# Patient Record
Sex: Female | Born: 1965 | Race: Black or African American | Hispanic: No | Marital: Married | Smoking: Never smoker
Health system: Southern US, Community
[De-identification: ages and names within clinical notes are randomized; demographics above are authoritative.]

## PROBLEM LIST (undated history)

## (undated) DIAGNOSIS — E119 Type 2 diabetes mellitus without complications: Secondary | ICD-10-CM

## (undated) DIAGNOSIS — F419 Anxiety disorder, unspecified: Secondary | ICD-10-CM

## (undated) DIAGNOSIS — F32A Depression, unspecified: Secondary | ICD-10-CM

## (undated) DIAGNOSIS — E7800 Pure hypercholesterolemia, unspecified: Secondary | ICD-10-CM

## (undated) DIAGNOSIS — I10 Essential (primary) hypertension: Secondary | ICD-10-CM

## (undated) HISTORY — PX: TUBAL LIGATION: SHX77

## (undated) HISTORY — PX: FOOT SURGERY: SHX648

## (undated) HISTORY — PX: BACK SURGERY: SHX140

---

## 2010-08-03 ENCOUNTER — Emergency Department (HOSPITAL_BASED_OUTPATIENT_CLINIC_OR_DEPARTMENT_OTHER)
Admission: EM | Admit: 2010-08-03 | Discharge: 2010-08-03 | Disposition: A | Discharge status: Home / Self Care | Payer: Managed Care, Other (non HMO) | Attending: Emergency Medicine | Admitting: Emergency Medicine

## 2010-08-03 DIAGNOSIS — I10 Essential (primary) hypertension: Secondary | ICD-10-CM | POA: Insufficient documentation

## 2010-08-03 DIAGNOSIS — G8929 Other chronic pain: Secondary | ICD-10-CM | POA: Insufficient documentation

## 2010-08-03 DIAGNOSIS — M542 Cervicalgia: Secondary | ICD-10-CM | POA: Insufficient documentation

## 2010-08-03 LAB — BASIC METABOLIC PANEL
Calcium: 9.3 mg/dL (ref 8.4–10.5)
Creatinine, Ser: 0.7 mg/dL (ref 0.4–1.2)
GFR calc Af Amer: >60 mL/min (ref >60)
GFR calc non Af Amer: >60 mL/min (ref >60)
Sodium: 137 mEq/L (ref 135–145)

## 2010-09-14 ENCOUNTER — Other Ambulatory Visit: Payer: Self-pay | Admitting: Neurosurgery

## 2010-09-14 DIAGNOSIS — M542 Cervicalgia: Secondary | ICD-10-CM

## 2010-09-25 MED ORDER — DIAZEPAM 2 MG PO TABS: 10.0000 mg | ORAL_TABLET | Freq: Once | ORAL | Status: DC | PRN

## 2010-09-26 MED ORDER — DIAZEPAM 2 MG PO TABS
10.0000 mg | ORAL_TABLET | Freq: Once | ORAL | Status: AC | PRN
  Administered 2010-09-27: 10 mg via ORAL

## 2010-09-27 ENCOUNTER — Ambulatory Visit
Admission: RE | Admit: 2010-09-27 | Discharge: 2010-09-27 | Disposition: A | Discharge status: Home / Self Care | Payer: Managed Care, Other (non HMO) | Source: Ambulatory Visit | Attending: Neurosurgery | Admitting: Neurosurgery

## 2010-09-27 VITALS — BP 100/85 | HR 84

## 2010-09-27 DIAGNOSIS — M542 Cervicalgia: Secondary | ICD-10-CM

## 2010-09-27 MED ORDER — DEXTROSE-NACL 5-0.45 % IV SOLN: INTRAVENOUS | Status: DC

## 2010-09-27 MED ORDER — IOHEXOL 300 MG/ML  SOLN
10.0000 mL | Freq: Once | INTRAMUSCULAR | Status: AC | PRN
  Administered 2010-09-27: 10 mL via INTRATHECAL

## 2010-09-27 MED ORDER — IOHEXOL 300 MG/ML  SOLN: 10.0000 mL | Freq: Once | INTRAMUSCULAR | Status: AC | PRN

## 2010-12-03 ENCOUNTER — Other Ambulatory Visit: Payer: Self-pay | Admitting: Neurosurgery

## 2010-12-03 ENCOUNTER — Ambulatory Visit
Admission: RE | Admit: 2010-12-03 | Discharge: 2010-12-03 | Disposition: A | Discharge status: Home / Self Care | Payer: Managed Care, Other (non HMO) | Source: Ambulatory Visit | Attending: Neurosurgery | Admitting: Neurosurgery

## 2010-12-03 DIAGNOSIS — M5020 Other cervical disc displacement, unspecified cervical region: Secondary | ICD-10-CM

## 2010-12-03 DIAGNOSIS — M5412 Radiculopathy, cervical region: Secondary | ICD-10-CM

## 2013-12-21 DIAGNOSIS — R519 Headache, unspecified: Secondary | ICD-10-CM

## 2013-12-21 DIAGNOSIS — K219 Gastro-esophageal reflux disease without esophagitis: Secondary | ICD-10-CM

## 2013-12-21 HISTORY — DX: Headache, unspecified: R51.9

## 2013-12-21 HISTORY — DX: Gastro-esophageal reflux disease without esophagitis: K21.9

## 2014-05-16 DIAGNOSIS — E6609 Other obesity due to excess calories: Secondary | ICD-10-CM | POA: Insufficient documentation

## 2014-05-16 DIAGNOSIS — E669 Obesity, unspecified: Secondary | ICD-10-CM | POA: Insufficient documentation

## 2014-05-16 HISTORY — DX: Other obesity due to excess calories: E66.09

## 2014-05-16 HISTORY — DX: Obesity, unspecified: E66.9

## 2014-08-09 DIAGNOSIS — E785 Hyperlipidemia, unspecified: Secondary | ICD-10-CM

## 2014-08-09 HISTORY — DX: Hyperlipidemia, unspecified: E78.5

## 2015-02-06 DIAGNOSIS — M722 Plantar fascial fibromatosis: Secondary | ICD-10-CM

## 2015-02-06 HISTORY — DX: Plantar fascial fibromatosis: M72.2

## 2015-05-11 DIAGNOSIS — K5909 Other constipation: Secondary | ICD-10-CM | POA: Insufficient documentation

## 2015-05-11 HISTORY — DX: Other constipation: K59.09

## 2015-12-20 DIAGNOSIS — I10 Essential (primary) hypertension: Secondary | ICD-10-CM

## 2015-12-20 HISTORY — DX: Essential (primary) hypertension: I10

## 2015-12-26 DIAGNOSIS — M542 Cervicalgia: Secondary | ICD-10-CM

## 2015-12-26 DIAGNOSIS — S29012A Strain of muscle and tendon of back wall of thorax, initial encounter: Secondary | ICD-10-CM

## 2015-12-26 HISTORY — DX: Strain of muscle and tendon of back wall of thorax, initial encounter: S29.012A

## 2015-12-26 HISTORY — DX: Cervicalgia: M54.2

## 2016-01-09 DIAGNOSIS — Z0000 Encounter for general adult medical examination without abnormal findings: Secondary | ICD-10-CM

## 2016-01-09 HISTORY — DX: Encounter for general adult medical examination without abnormal findings: Z00.00

## 2017-02-07 DIAGNOSIS — G4700 Insomnia, unspecified: Secondary | ICD-10-CM

## 2017-02-07 HISTORY — DX: Insomnia, unspecified: G47.00

## 2019-07-27 DIAGNOSIS — E1165 Type 2 diabetes mellitus with hyperglycemia: Secondary | ICD-10-CM | POA: Insufficient documentation

## 2019-07-27 HISTORY — DX: Type 2 diabetes mellitus with hyperglycemia: E11.65

## 2021-03-28 DIAGNOSIS — M2021 Hallux rigidus, right foot: Secondary | ICD-10-CM

## 2021-03-28 DIAGNOSIS — M7750 Other enthesopathy of unspecified foot: Secondary | ICD-10-CM

## 2021-03-28 HISTORY — DX: Hallux rigidus, right foot: M20.21

## 2021-03-28 HISTORY — DX: Other enthesopathy of unspecified foot and ankle: M77.50

## 2021-05-04 HISTORY — PX: FOOT SURGERY: SHX648

## 2021-05-09 DIAGNOSIS — Z9889 Other specified postprocedural states: Secondary | ICD-10-CM

## 2021-05-09 HISTORY — DX: Other specified postprocedural states: Z98.890

## 2022-05-04 ENCOUNTER — Inpatient Hospital Stay: Admission: RE | Admit: 2022-05-04 | Payer: Self-pay | Source: Ambulatory Visit

## 2022-05-04 ENCOUNTER — Ambulatory Visit (INDEPENDENT_AMBULATORY_CARE_PROVIDER_SITE_OTHER): Payer: Managed Care, Other (non HMO)

## 2022-05-04 ENCOUNTER — Ambulatory Visit
Admission: EM | Admit: 2022-05-04 | Discharge: 2022-05-04 | Disposition: A | Discharge status: Home / Self Care | Payer: Managed Care, Other (non HMO) | Attending: Urgent Care | Admitting: Urgent Care

## 2022-05-04 DIAGNOSIS — M25562 Pain in left knee: Secondary | ICD-10-CM

## 2022-05-04 DIAGNOSIS — M1712 Unilateral primary osteoarthritis, left knee: Secondary | ICD-10-CM

## 2022-05-04 DIAGNOSIS — E119 Type 2 diabetes mellitus without complications: Secondary | ICD-10-CM

## 2022-05-04 DIAGNOSIS — I10 Essential (primary) hypertension: Secondary | ICD-10-CM

## 2022-05-04 HISTORY — DX: Pure hypercholesterolemia, unspecified: E78.00

## 2022-05-04 HISTORY — DX: Type 2 diabetes mellitus without complications: E11.9

## 2022-05-04 HISTORY — DX: Anxiety disorder, unspecified: F41.9

## 2022-05-04 HISTORY — DX: Essential (primary) hypertension: I10

## 2022-05-04 HISTORY — DX: Depression, unspecified: F32.A

## 2022-05-04 MED ORDER — PREDNISONE 20 MG PO TABS: ORAL_TABLET | ORAL | 0 refills | Status: DC

## 2022-05-04 MED ORDER — HYDROCHLOROTHIAZIDE 12.5 MG PO TABS: 12.5000 mg | ORAL_TABLET | Freq: Every day | ORAL | 0 refills | Status: DC

## 2022-05-04 NOTE — ED Provider Notes (Signed)
[facility]  Note:  This document was prepared using Systems analyst and may include unintentional dictation errors.  MRN: [id number] DOB: 11/21/1965  Subjective:   Ana Ruiz is a 57 y.o. female presenting for 3-day history of persistent left knee pain with swelling.  Patient has been working through the arthritis in her back and was advised by her orthopedist to start using her legs more to offload her back.  She has been doing so and now her left knee is hurting.  No fall, trauma, history of knee surgery.  No history of gout.  Patient does have a history of hypertension, forgot to take her blood pressure medication this morning.  Has type 2 diabetes treated without insulin, last A1c 3 months ago was 6.1% per patient.  No current facility-administered medications for this encounter.  Current Outpatient Medications:    amitriptyline (ELAVIL) 50 MG tablet, Take by mouth., Disp: , Rfl:    amLODipine (NORVASC) 5 MG tablet, , Disp: , Rfl:    linaclotide (LINZESS) 145 MCG CAPS capsule, Take by mouth., Disp: , Rfl:    metFORMIN (GLUCOPHAGE-XR) 500 MG 24 hr tablet, Take by mouth., Disp: , Rfl:    metoprolol succinate (TOPROL-XL) 50 MG 24 hr tablet, , Disp: , Rfl:    pravastatin (PRAVACHOL) 20 MG tablet, , Disp: , Rfl:    venlafaxine (EFFEXOR) 37.5 MG tablet, Take by mouth., Disp: , Rfl:    Allergies  Allergen Reactions   Naproxen Nausea Only    CP    Past Medical History:  Diagnosis Date   Anxiety    Depression    Diabetes mellitus without complication (HCC)    High cholesterol    Hypertension      Past Surgical History:  Procedure Laterality Date   BACK SURGERY     FOOT SURGERY      No family history on file.  Social History   Tobacco Use   Smoking status: Never   Smokeless tobacco: Never  Vaping Use   Vaping Use: Never used  Substance Use Topics   Alcohol use: Never   Drug use: Never    ROS   Objective:    Vitals: BP (!) 179/101 (BP Location: Right Arm)   Pulse (!) 102   Temp 98.5 F (36.9 C)   Resp 20   LMP 11/12/2010   SpO2 97%   BP Readings from Last 3 Encounters:  05/04/22 (!) 179/101  09/27/10 100/85   Physical Exam Constitutional:      General: She is not in acute distress.    Appearance: Normal appearance. She is well-developed. She is not ill-appearing, toxic-appearing or diaphoretic.  HENT:     Head: Normocephalic and atraumatic.     Nose: Nose normal.     Mouth/Throat:     Mouth: Mucous membranes are moist.  Eyes:     General: No scleral icterus.       Right eye: No discharge.        Left eye: No discharge.     Extraocular Movements: Extraocular movements intact.  Cardiovascular:     Rate and Rhythm: Normal rate and regular rhythm.     Heart sounds: Normal heart sounds. No murmur heard.    No friction rub. No gallop.  Pulmonary:     Effort: Pulmonary effort is normal. No respiratory distress.     Breath sounds: No stridor. No wheezing, rhonchi or rales.  Chest:     Chest wall:  No tenderness.  Musculoskeletal:     Left knee: Swelling and effusion present. No deformity, erythema, ecchymosis, lacerations, bony tenderness or crepitus. Decreased range of motion. Tenderness present over the medial joint line, lateral joint line and patellar tendon. Normal alignment, normal meniscus and normal patellar mobility.  Skin:    General: Skin is warm and dry.  Neurological:     General: No focal deficit present.     Mental Status: She is alert and oriented to person, place, and time.     Cranial Nerves: No cranial nerve deficit.     Motor: No weakness.     Coordination: Coordination normal.     Gait: Gait normal.     Comments: Negative Romberg and pronator drift.  No facial asymmetry.  Psychiatric:        Mood and Affect: Mood normal.        Behavior: Behavior normal.     DG Knee Complete 4 Views Left  Result Date: 05/04/2022 CLINICAL DATA:  Left knee pain. EXAM:  LEFT KNEE - COMPLETE 4+ VIEW COMPARISON:  None Available. FINDINGS: No fracture. No subluxation or dislocation. Subtle hypertrophic spurring noted all 3 compartments. Small joint effusion evident. IMPRESSION: Mild tricompartmental degenerative changes with small joint effusion. Electronically Signed   By: Misty Stanley M.D.   On: 05/04/2022 13:13    Applied a 4" Ace wrap to the left knee  Assessment and Plan :   PDMP not reviewed this encounter.  1. Acute pain of left knee   2. Arthritis of left knee   3. Type 2 diabetes mellitus treated without insulin ([place])   4. Essential hypertension     Physical exam findings not consistent with an acute encephalopathy, hypertensive emergency.  Emphasized need for medical compliance with her blood pressure medication.  She would benefit tremendously from using prednisone in the context of having arthritis for her severe knee pain.  I will supplement her treatment with hydrochlorothiazide to avoid further increases in her blood pressure.  She is to follow-up with orthopedist soon as possible. Counseled patient on potential for adverse effects with medications prescribed/recommended today, ER and return-to-clinic precautions discussed, patient verbalized understanding.    Ana Ruiz, [place] 05/04/22 1334

## 2022-05-04 NOTE — Discharge Instructions (Signed)
Follow-up with your orthopedist soon as possible.  You may end up needing an MRI to evaluate the soft tissue of your knee including the ligaments and meniscus.  In the meantime, take the prednisone as a strong anti-inflammatory to help with your severe knee pain.  For the next 7 days I also mentioned to take hydrochlorothiazide to help avoid further increases in blood pressure.  Make sure you continue to take your regular blood pressure medicine as well.  Wear the Ace wrap during the day but not at night when you sleep.

## 2022-05-04 NOTE — ED Triage Notes (Signed)
Pt c/o left knee pain/swelling x 2-3 days-denies injury-NAD-steady gait

## 2022-06-12 DIAGNOSIS — D709 Neutropenia, unspecified: Secondary | ICD-10-CM | POA: Insufficient documentation

## 2022-06-12 HISTORY — DX: Neutropenia, unspecified: D70.9

## 2022-08-13 HISTORY — PX: HYSTEROSCOPY WITH D & C: SHX1775

## 2023-03-10 DIAGNOSIS — R069 Unspecified abnormalities of breathing: Secondary | ICD-10-CM | POA: Diagnosis not present

## 2023-04-15 DIAGNOSIS — Z981 Arthrodesis status: Secondary | ICD-10-CM

## 2023-04-15 DIAGNOSIS — M5033 Other cervical disc degeneration, cervicothoracic region: Secondary | ICD-10-CM

## 2023-04-15 HISTORY — DX: Arthrodesis status: Z98.1

## 2023-04-15 HISTORY — DX: Other cervical disc degeneration, cervicothoracic region: M50.33

## 2023-04-17 DIAGNOSIS — D250 Submucous leiomyoma of uterus: Secondary | ICD-10-CM | POA: Insufficient documentation

## 2023-04-17 DIAGNOSIS — N950 Postmenopausal bleeding: Secondary | ICD-10-CM | POA: Insufficient documentation

## 2023-04-17 DIAGNOSIS — D251 Intramural leiomyoma of uterus: Secondary | ICD-10-CM | POA: Insufficient documentation

## 2023-04-17 HISTORY — DX: Submucous leiomyoma of uterus: D25.0

## 2023-04-17 HISTORY — DX: Postmenopausal bleeding: N95.0

## 2023-06-05 ENCOUNTER — Encounter: Payer: Self-pay | Admitting: Family Medicine

## 2023-06-05 ENCOUNTER — Ambulatory Visit: Admitting: Family Medicine

## 2023-06-05 VITALS — BP 130/84 | HR 91 | Temp 98.5°F | Ht 65.0 in | Wt 211.1 lb

## 2023-06-05 DIAGNOSIS — I10 Essential (primary) hypertension: Secondary | ICD-10-CM

## 2023-06-05 DIAGNOSIS — Z8249 Family history of ischemic heart disease and other diseases of the circulatory system: Secondary | ICD-10-CM

## 2023-06-05 DIAGNOSIS — E785 Hyperlipidemia, unspecified: Secondary | ICD-10-CM

## 2023-06-05 DIAGNOSIS — N950 Postmenopausal bleeding: Secondary | ICD-10-CM

## 2023-06-05 DIAGNOSIS — M542 Cervicalgia: Secondary | ICD-10-CM

## 2023-06-05 DIAGNOSIS — E1165 Type 2 diabetes mellitus with hyperglycemia: Secondary | ICD-10-CM

## 2023-06-05 DIAGNOSIS — G4709 Other insomnia: Secondary | ICD-10-CM | POA: Diagnosis not present

## 2023-06-05 DIAGNOSIS — K5909 Other constipation: Secondary | ICD-10-CM

## 2023-06-05 DIAGNOSIS — R0789 Other chest pain: Secondary | ICD-10-CM

## 2023-06-05 DIAGNOSIS — Z981 Arthrodesis status: Secondary | ICD-10-CM

## 2023-06-05 DIAGNOSIS — Z23 Encounter for immunization: Secondary | ICD-10-CM | POA: Diagnosis not present

## 2023-06-05 DIAGNOSIS — Z7984 Long term (current) use of oral hypoglycemic drugs: Secondary | ICD-10-CM

## 2023-06-05 HISTORY — DX: Encounter for immunization: Z23

## 2023-06-05 HISTORY — DX: Family history of ischemic heart disease and other diseases of the circulatory system: Z82.49

## 2023-06-05 HISTORY — DX: Other chest pain: R07.89

## 2023-06-05 MED ORDER — AMLODIPINE BESYLATE 5 MG PO TABS: 5.0000 mg | ORAL_TABLET | Freq: Every day | ORAL | 1 refills | Status: DC

## 2023-06-05 MED ORDER — VENLAFAXINE HCL 37.5 MG PO TABS: 37.5000 mg | ORAL_TABLET | Freq: Every day | ORAL | 1 refills | Status: DC

## 2023-06-05 MED ORDER — PRAVASTATIN SODIUM 20 MG PO TABS: 20.0000 mg | ORAL_TABLET | Freq: Every day | ORAL | 1 refills | Status: DC

## 2023-06-05 MED ORDER — METHOCARBAMOL 500 MG PO TABS: 500.0000 mg | ORAL_TABLET | Freq: Three times a day (TID) | ORAL | 0 refills | Status: DC | PRN

## 2023-06-05 MED ORDER — LINACLOTIDE 145 MCG PO CAPS: 145.0000 ug | ORAL_CAPSULE | Freq: Every day | ORAL | 1 refills | Status: DC

## 2023-06-05 MED ORDER — METOPROLOL SUCCINATE ER 50 MG PO TB24: 50.0000 mg | ORAL_TABLET | Freq: Every day | ORAL | 1 refills | Status: DC

## 2023-06-05 MED ORDER — METFORMIN HCL ER 500 MG PO TB24: 500.0000 mg | ORAL_TABLET | Freq: Two times a day (BID) | ORAL | 1 refills | Status: DC

## 2023-06-05 MED ORDER — AMITRIPTYLINE HCL 50 MG PO TABS: 50.0000 mg | ORAL_TABLET | Freq: Every day | ORAL | 1 refills | Status: DC

## 2023-06-05 MED ORDER — TIZANIDINE HCL 4 MG PO TABS: 4.0000 mg | ORAL_TABLET | Freq: Every day | ORAL | 1 refills | Status: DC

## 2023-06-05 NOTE — Progress Notes (Signed)
 Patient Office Visit  Assessment & Plan:  Benign essential hypertension -     amLODIPine Besylate; Take 1 tablet (5 mg total) by mouth daily.  Dispense: 90 tablet; Refill: 1 -     Metoprolol Succinate ER; Take 1 tablet (50 mg total) by mouth daily. Take with or immediately following a meal.  Dispense: 90 tablet; Refill: 1 -     CBC with Differential/Platelet -     COMPLETE METABOLIC PANEL WITHOUT GFR  Hyperlipidemia, unspecified hyperlipidemia type -     Pravastatin Sodium; Take 1 tablet (20 mg total) by mouth daily.  Dispense: 90 tablet; Refill: 1 -     Lipid panel  Other insomnia -     Amitriptyline HCl; Take 1 tablet (50 mg total) by mouth at bedtime.  Dispense: 90 tablet; Refill: 1  Type 2 diabetes mellitus with hyperglycemia, without long-term current use of insulin (HCC) -     metFORMIN HCl ER; Take 1 tablet (500 mg total) by mouth 2 (two) times daily with a meal.  Dispense: 180 tablet; Refill: 1 -     Hemoglobin A1c  Need for shingles vaccine -     Varicella-zoster vaccine IM  Other constipation -     TSH  Neck pain, bilateral -     tiZANidine HCl; Take 1 tablet (4 mg total) by mouth at bedtime.  Dispense: 90 tablet; Refill: 1 -     Methocarbamol; Take 1 tablet (500 mg total) by mouth every 8 (eight) hours as needed for muscle spasms.  Dispense: 90 tablet; Refill: 0  S/P cervical spinal fusion -     tiZANidine HCl; Take 1 tablet (4 mg total) by mouth at bedtime.  Dispense: 90 tablet; Refill: 1 -     Methocarbamol; Take 1 tablet (500 mg total) by mouth every 8 (eight) hours as needed for muscle spasms.  Dispense: 90 tablet; Refill: 0  Family history of heart disease -     Ambulatory referral to Cardiology  Atypical chest pain -     Ambulatory referral to Cardiology -     EKG 12-Lead  Postmenopausal bleeding  Other orders -     linaCLOtide; Take 1 capsule (145 mcg total) by mouth daily before breakfast.  Dispense: 90 capsule; Refill: 1 -     Venlafaxine HCl; Take  1 tablet (37.5 mg total) by mouth daily.  Dispense: 90 tablet; Refill: 1  Test results were reviewed and analyzed as part of the medical decision making of this visit.  Reviewed previous notes from [facility], orthopedic notes during office visit.  Recommend healthy diet i.e mediterranean/DASH diet, consistent exercise - 30 minutes 5 day per week, and gradual weight loss. EKG-normal sinus rhythm, no acute changes noted.  No previous EKG tracing to review however she did have a previous EKG done last year.  Cardiology consult ordered given risk factors and ongoing chest pain.  Return in 3 months or sooner if necessary. Return in about 3 months (around 09/04/2023), or if symptoms worsen or fail to improve.   Subjective:    Patient ID: Ana Ruiz, female    DOB: 11/25/1965  Age: 58 y.o. MRN: [id number]  Chief Complaint  Patient presents with   Medical Management of Chronic Issues   Establish Care    HPI Patient is here to establish primary care and discuss chronic medical issues which include type 2 diabetes, hypertension, hyperlipidemia, ongoing shoulder pain and arm pain, discussion of family history of heart disease,  insomnia.  Type 2 diabetes-previously controlled.  Denies diarrhea, peripheral swelling, hypoglycemia, excessive thirst, excessive urination, visual fluctuation, and fatigue.  Making an effort on diet control and exercise.  Has an up-to-date dilated eye exam.  Using medication as prescribed without difficulty.  Patient's weight remains stable.  Patient does eat healthy for the most part and stays active. Pt is eating better and is going to [facility] with her husband.  Hyperlipidemia-denies unusual muscle aches or muscle cramps or difficulty tolerating statin therapy.  Aware of need for diet control, exercise and healthy eating.  Patient is aware not to consume grapefruit juice or pomegranate juice. HTN-using antihypertensive medication without difficulty.  Denies associated  signs and symptoms including chest pain, shortness of breath, cough headache, peripheral swelling cramps spasms and palpitations.  Voices understanding of the potential for interference with blood pressure control with substances including high sodium intake, decongestions, herbal supplements weight loss supplements nutritional supplements.  Blood pressures at home are less than 140/90.   Atypical chest pain-patient has been having atypical chest pain sometimes on the left side sometimes on the right sometimes the shoulder is involved sometimes the shoulder is not fully ready pain.  She mentioned this to orthopedic and he was concerned that this could be a cardiac cause of chest pain shoulder pain.  Family history is positive for CAD in multiple relatives.  Patient has not seen cardiology in the past.  Patient has not had any chest pain or exertional shortness of breath with exercise.  Patient has been going to the [facility]. Insomnia- The 10-year ASCVD risk score (Arnett DK, et al., 2019) is: 10.2%  Past Medical History:  Diagnosis Date   Anxiety    Depression    Diabetes mellitus without complication (HCC)    High cholesterol    Hypertension    Past Surgical History:  Procedure Laterality Date   BACK SURGERY     ANTERIOR CERVICAL DISCECTOMY W/ FUSION   FOOT SURGERY Right 05/04/2021   Dr Romualdo Bolk- CHEILECTOMY   HYSTEROSCOPY WITH D & C  08/13/2022   Dr Janae Sauce   TUBAL LIGATION     Social History   Tobacco Use   Smoking status: Never   Smokeless tobacco: Never  Vaping Use   Vaping status: Never Used  Substance Use Topics   Alcohol use: Never   Drug use: Never   Family History  Problem Relation Age of Onset   Colon cancer Mother    Ovarian cancer Mother    Heart disease Mother    Pulmonary embolism Mother    Diabetes Mellitus II Maternal Aunt    Hypertension Maternal Uncle    Heart disease Maternal Uncle    Heart attack Maternal Uncle    Heart attack Maternal Uncle    Heart attack  Maternal Uncle    Leukemia Cousin    Lymphoma Cousin    Allergies  Allergen Reactions   Meloxicam Nausea And Vomiting   Naproxen Nausea Only    CP    ROS    Objective:    BP 130/84   Pulse 91   Temp 98.5 F (36.9 C)   Ht 5\' 5"  (1.651 m)   Wt 211 lb 2 oz (95.8 kg)   LMP 11/12/2010   SpO2 99%   BMI 35.13 kg/m  BP Readings from Last 3 Encounters:  06/05/23 130/84  05/04/22 (!) 179/101  09/27/10 100/85   Wt Readings from Last 3 Encounters:  06/05/23 211 lb 2 oz (95.8 kg)  Physical Exam Vitals and nursing note reviewed.  Constitutional:      Appearance: Normal appearance.  HENT:     Head: Normocephalic.     Right Ear: Tympanic membrane, ear canal and external ear normal.     Left Ear: Tympanic membrane, ear canal and external ear normal.  Eyes:     Extraocular Movements: Extraocular movements intact.     Conjunctiva/sclera: Conjunctivae normal.     Pupils: Pupils are equal, round, and reactive to light.  Cardiovascular:     Rate and Rhythm: Normal rate and regular rhythm.     Heart sounds: Normal heart sounds.  Pulmonary:     Effort: Pulmonary effort is normal.     Breath sounds: Normal breath sounds.  Musculoskeletal:     Right lower leg: No edema.     Left lower leg: No edema.  Neurological:     General: No focal deficit present.     Mental Status: She is alert and oriented to person, place, and time.  Psychiatric:        Mood and Affect: Mood normal.        Behavior: Behavior normal.        Thought Content: Thought content normal.        Judgment: Judgment normal.      Results for orders placed or performed in visit on 06/05/23  CBC with Differential/Platelet  Result Value Ref Range   WBC 4.5 3.8 - 10.8 Thousand/uL   RBC 4.95 3.80 - 5.10 Million/uL   Hemoglobin 13.3 11.7 - 15.5 g/dL   HCT 09.8 11.9 - 14.7 %   MCV 83.2 80.0 - 100.0 fL   MCH 26.9 (L) 27.0 - 33.0 pg   MCHC 32.3 32.0 - 36.0 g/dL   RDW 82.9 56.2 - 13.0 %   Platelets 205 140 -  400 Thousand/uL   MPV 10.9 7.5 - 12.5 fL   Neutro Abs 2,039 1,500 - 7,800 cells/uL   Absolute Lymphocytes 1,935 850 - 3,900 cells/uL   Absolute Monocytes 428 200 - 950 cells/uL   Eosinophils Absolute 68 15 - 500 cells/uL   Basophils Absolute 32 0 - 200 cells/uL   Neutrophils Relative % 45.3 %   Total Lymphocyte 43.0 %   Monocytes Relative 9.5 %   Eosinophils Relative 1.5 %   Basophils Relative 0.7 %  COMPLETE METABOLIC PANEL WITHOUT GFR  Result Value Ref Range   Glucose, Bld 96 65 - 99 mg/dL   BUN 9 7 - 25 mg/dL   Creat 8.65 7.84 - 6.96 mg/dL   BUN/Creatinine Ratio SEE NOTE: 6 - 22 (calc)   Sodium 140 135 - 146 mmol/L   Potassium 4.0 3.5 - 5.3 mmol/L   Chloride 100 98 - 110 mmol/L   CO2 28 20 - 32 mmol/L   Calcium 9.5 8.6 - 10.4 mg/dL   Total Protein 7.2 6.1 - 8.1 g/dL   Albumin 4.3 3.6 - 5.1 g/dL   Globulin 2.9 1.9 - 3.7 g/dL (calc)   AG Ratio 1.5 1.0 - 2.5 (calc)   Total Bilirubin 0.3 0.2 - 1.2 mg/dL   Alkaline phosphatase (APISO) 66 37 - 153 U/L   AST 19 10 - 35 U/L   ALT 25 6 - 29 U/L  Hemoglobin A1c  Result Value Ref Range   Hgb A1c MFr Bld 6.8 (H) <5.7 % of total Hgb   Mean Plasma Glucose 148 mg/dL   eAG (mmol/L) 8.2 mmol/L  Lipid panel  Result Value Ref Range  Cholesterol 206 (H) <200 mg/dL   HDL 94 > OR = 50 mg/dL   Triglycerides 85 <161 mg/dL   LDL Cholesterol (Calc) 94 mg/dL (calc)   Total CHOL/HDL Ratio 2.2 <5.0 (calc)   Non-HDL Cholesterol (Calc) 112 <130 mg/dL (calc)  TSH  Result Value Ref Range   TSH 1.63 0.40 - 4.50 mIU/L

## 2023-06-06 ENCOUNTER — Encounter: Payer: Self-pay | Admitting: Family Medicine

## 2023-06-06 LAB — COMPLETE METABOLIC PANEL WITHOUT GFR
AG Ratio: 1.5 (calc) (ref 1.0–2.5)
ALT: 25 U/L (ref 6–29)
AST: 19 U/L (ref 10–35)
Albumin: 4.3 g/dL (ref 3.6–5.1)
Alkaline phosphatase (APISO): 66 U/L (ref 37–153)
BUN: 9 mg/dL (ref 7–25)
CO2: 28 mmol/L (ref 20–32)
Calcium: 9.5 mg/dL (ref 8.6–10.4)
Chloride: 100 mmol/L (ref 98–110)
Creat: 0.84 mg/dL (ref 0.50–1.03)
Globulin: 2.9 g/dL (ref 1.9–3.7)
Glucose, Bld: 96 mg/dL (ref 65–99)
Potassium: 4 mmol/L (ref 3.5–5.3)
Sodium: 140 mmol/L (ref 135–146)
Total Bilirubin: 0.3 mg/dL (ref 0.2–1.2)
Total Protein: 7.2 g/dL (ref 6.1–8.1)

## 2023-06-06 LAB — CBC WITH DIFFERENTIAL/PLATELET
Absolute Lymphocytes: 1935 {cells}/uL (ref 850–3900)
Absolute Monocytes: 428 {cells}/uL (ref 200–950)
Basophils Absolute: 32 {cells}/uL (ref 0–200)
Basophils Relative: 0.7 %
Eosinophils Absolute: 68 {cells}/uL (ref 15–500)
Eosinophils Relative: 1.5 %
HCT: 41.2 % (ref 35.0–45.0)
Hemoglobin: 13.3 g/dL (ref 11.7–15.5)
MCH: 26.9 pg — ABNORMAL LOW (ref 27.0–33.0)
MCHC: 32.3 g/dL (ref 32.0–36.0)
MCV: 83.2 fL (ref 80.0–100.0)
MPV: 10.9 fL (ref 7.5–12.5)
Monocytes Relative: 9.5 %
Neutro Abs: 2039 {cells}/uL (ref 1500–7800)
Neutrophils Relative %: 45.3 %
Platelets: 205 10*3/uL (ref 140–400)
RBC: 4.95 10*6/uL (ref 3.80–5.10)
RDW: 14.1 % (ref 11.0–15.0)
Total Lymphocyte: 43 %
WBC: 4.5 10*3/uL (ref 3.8–10.8)

## 2023-06-06 LAB — LIPID PANEL
Cholesterol: 206 mg/dL — ABNORMAL HIGH (ref <200)
HDL: 94 mg/dL (ref >=50)
LDL Cholesterol (Calc): 94 mg/dL
Non-HDL Cholesterol (Calc): 112 mg/dL (ref <130)
Total CHOL/HDL Ratio: 2.2 (calc) (ref <5.0)
Triglycerides: 85 mg/dL (ref <150)

## 2023-06-06 LAB — HEMOGLOBIN A1C
Hgb A1c MFr Bld: 6.8 %{Hb} — ABNORMAL HIGH (ref <5.7)
Mean Plasma Glucose: 148 mg/dL
eAG (mmol/L): 8.2 mmol/L

## 2023-06-06 LAB — TSH: TSH: 1.63 m[IU]/L (ref 0.40–4.50)

## 2023-06-09 ENCOUNTER — Other Ambulatory Visit: Payer: Self-pay

## 2023-06-09 DIAGNOSIS — E1165 Type 2 diabetes mellitus with hyperglycemia: Secondary | ICD-10-CM

## 2023-06-09 MED ORDER — TIRZEPATIDE 2.5 MG/0.5ML ~~LOC~~ SOAJ: 2.5000 mg | SUBCUTANEOUS | 1 refills | Status: DC

## 2023-06-11 ENCOUNTER — Ambulatory Visit: Payer: Managed Care, Other (non HMO) | Admitting: Family Medicine

## 2023-07-21 DIAGNOSIS — E119 Type 2 diabetes mellitus without complications: Secondary | ICD-10-CM | POA: Insufficient documentation

## 2023-07-21 DIAGNOSIS — F32A Depression, unspecified: Secondary | ICD-10-CM | POA: Insufficient documentation

## 2023-07-21 DIAGNOSIS — F419 Anxiety disorder, unspecified: Secondary | ICD-10-CM | POA: Insufficient documentation

## 2023-07-22 ENCOUNTER — Ambulatory Visit

## 2023-07-22 VITALS — BP 150/98 | HR 114 | Ht 65.0 in | Wt 213.0 lb

## 2023-07-22 DIAGNOSIS — R000 Tachycardia, unspecified: Secondary | ICD-10-CM

## 2023-07-22 DIAGNOSIS — I10 Essential (primary) hypertension: Secondary | ICD-10-CM

## 2023-07-22 DIAGNOSIS — R0789 Other chest pain: Secondary | ICD-10-CM

## 2023-07-22 DIAGNOSIS — E782 Mixed hyperlipidemia: Secondary | ICD-10-CM | POA: Diagnosis not present

## 2023-07-22 HISTORY — DX: Tachycardia, unspecified: R00.0

## 2023-07-22 MED ORDER — CARVEDILOL 12.5 MG PO TABS: 12.5000 mg | ORAL_TABLET | Freq: Two times a day (BID) | ORAL | 3 refills | Status: AC

## 2023-07-22 NOTE — Assessment & Plan Note (Signed)
 Lipid panel from 06/05/2023 suboptimal with LDL 94, triglycerides 85, HDL 94, total cholesterol 098.  Continue pravastatin  20 mg once daily. If LDL remains above 70 mg/dL would recommend titrating up pravastatin  to 40 mg once daily.

## 2023-07-22 NOTE — Progress Notes (Signed)
 Cardiology Consultation:    Date:  07/22/2023   ID:  Ana Ruiz, DOB 1965-08-28, MRN [id number]  PCP:  Amadeo June, MD  Cardiologist:  Daymon Evans Romario Tith, MD   Referring MD: Amadeo June, MD   No chief complaint on file.    ASSESSMENT AND PLAN:   Ana Ruiz 58 year old woman with no significant prior cardiac history. Has longstanding history of hypertension, hyperlipidemia, elevated heart rates at baseline, diabetes mellitus, C-spine surgery in 2013 and recent hysterectomy 07/08/2023 for postmenopausal bleeding here for further evaluation of atypical chest pain which appears more consistent with pain in the neck radiating to bilateral shoulders.  No exertional symptoms of chest pain.  Problem List Items Addressed This Visit     Benign essential hypertension   Uncontrolled blood pressures. Target blood pressure below 130/80 mmHg. Continue amlodipine  5 mg once daily. Reports she was previously on losartan-hydrochlorothiazide  but has not been taking this for a long time.  Will take this off her medication list.  Discontinue metoprolol  succinate switch to carvedilol 12.5 mg twice daily for better blood pressure control profile. Advised to keep herself well-hydrated. Advised to reduce salt intake.  Will obtain transthoracic echocardiogram for further cardiac structure and function assessment.       Relevant Medications   carvedilol (COREG) 12.5 MG tablet   Hyperlipidemia   Lipid panel from 06/05/2023 suboptimal with LDL 94, triglycerides 85, HDL 94, total cholesterol 528.  Continue pravastatin  20 mg once daily. If LDL remains above 70 mg/dL would recommend titrating up pravastatin  to 40 mg once daily.       Relevant Medications   carvedilol (COREG) 12.5 MG tablet   Other Relevant Orders   ECHOCARDIOGRAM COMPLETE   Atypical chest pain - Primary   Pain does not appear cardiac in description. She does have cardiovascular risk factors however has excellent  functional status and denies any ongoing cardiac symptoms at this time.  Will obtain cardiac structure and function assessment with transthoracic echocardiogram given longstanding history of uncontrolled blood pressure. If no significant LV dysfunction or wall motion abnormalities, we will hold off on any further stress testing at this time.  If the nature of her symptoms change and more suggestive of cardiac causes, can consider further evaluation with stress test versus cardiac CT imaging.        Relevant Orders   EKG 12-Lead (Completed)   ECHOCARDIOGRAM COMPLETE   Sinus tachycardia   Reports long history of sinus tachycardia.  No obvious cause. She appears to be relatively asymptomatic from this. Will switch from metoprolol  succinate to carvedilol. Advised to keep herself well-hydrated and reduce salt intake.  Defer to PCP to assess for noncardiac causes of sinus tachycardia.      Return to clinic tentatively in 2 months.   History of Present Illness:    Ana Ruiz is a 58 y.o. female who is being seen today for the evaluation of chest pain at the request of Amadeo June, MD. Recently establish care with her PCP in April 2025.  Has history of hypertension, hyperlipidemia, type 2 diabetes mellitus, C-spine surgery in 2013 and recent hysterectomy 07/08/2023.  Referred for further evaluation of chest pain.  Pleasant woman here for the visit by herself.  Works in [employer] ge[employer] and currently off work for the last few weeks given her recent hysterectomy.  She is recovering well.   Referred by PCP for further evaluation of atypical chest pain.  She describes this as discomfort in the neck that radiates  to both sides down the shoulders, occurs randomly and last for 4 to 5 seconds.  Not a sustained discomfort.  Can be intense at times up to 8 on 10 intensity.  She does not have any symptoms of shortness of breath or chest pain with exertion.  Denies any chest pressure or heaviness.   Does not routinely exercise but walks over 10,000 steps a day.  Mentions she is able to walk without any significant limitations.  Mentions she has known about chronically elevated heart rates at baseline.  Denies any palpitations, lightheadedness, dizziness or syncopal episodes.  Does not smoke. Does not consume alcohol. No other illicit drug use.  She does report family history of coronary artery disease at an early age.  EKG in the clinic today show sinus tachycardia heart rate 114/min, nonspecific ST-T changes in inferolateral leads  Blood work from 07/09/2023 with hemoglobin 14, hematocrit 43.7 Platelets 219, WBC 10.8 Sodium 137, potassium 4.1 BUN 9, creatinine 0.83, EGFR 82  Hemoglobin A1c 6.5 on 07/08/2023.  TSH normal 1.63. Lipid panel 06/05/2023 with total cholesterol 206, HDL 94, triglycerides 85, LDL 94.  Suboptimal.   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     High cholesterol    Hyperlipidemia 08/09/2014   Hypertension    Insomnia 02/07/2017   Intramural and submucous leiomyoma of uterus 04/17/2023   Neck pain, bilateral 12/26/2015   Need for shingles vaccine 06/05/2023   Obesity (BMI 30-39.9) 05/16/2014   Obesity due to excess calories with serious comorbidity 05/16/2014   Other constipation 05/11/2015   Persistent headaches 12/21/2013   Last Assessment & Plan:   Start Elavil  25mg  PO QHS. RTC for CPE in  the next few weeks.     Plantar fasciitis, bilateral 02/06/2015   Postmenopausal bleeding 04/17/2023   Preventative health care 01/09/2016   S/P cervical spinal fusion 04/15/2023   S/P foot surgery, right 05/09/2021   Strain of thoracic spine 12/26/2015   Type 2 diabetes mellitus with hyperglycemia, without long-term current use of insulin (HCC) 07/27/2019    Past Surgical History:  Procedure Laterality Date   BACK SURGERY     ANTERIOR CERVICAL DISCECTOMY W/ FUSION   FOOT SURGERY Right 05/04/2021   Dr Ellwood Haber- CHEILECTOMY   HYSTEROSCOPY WITH D & C  08/13/2022   Dr Overton Blotter   TUBAL LIGATION      Current Medications: Current Meds  Medication Sig   amitriptyline  (ELAVIL ) 50 MG tablet Take 1 tablet (50 mg total) by mouth at bedtime.   amLODipine  (NORVASC ) 5 MG tablet Take 1 tablet (5 mg total) by mouth daily.   Blood Glucose Monitoring Suppl (GLUCOCOM BLOOD GLUCOSE MONITOR) DEVI 1 each by Other route See admin instructions.   bupivacaine (MARCAINE) 0.25 % injection Inject 2.5 mLs into the skin once.   carvedilol (COREG) 12.5 MG tablet Take 1 tablet (12.5 mg total) by mouth 2 (two) times daily.   glucose blood (PRECISION QID  TEST) test strip 1 each by Other route as directed.   ibuprofen (ADVIL) 600 MG tablet Take 600 mg by mouth every 6 (six) hours as needed for mild pain (pain score 1-3) or moderate pain (pain score 4-6).   linaclotide  (LINZESS ) 145 MCG CAPS capsule Take 1 capsule (145 mcg total) by mouth daily before breakfast.   meloxicam (MOBIC) 7.5 MG tablet Take 7.5 mg by mouth daily.   methocarbamol  (ROBAXIN ) 500 MG tablet Take 1 tablet (500 mg total) by mouth every 8 (eight) hours as needed for muscle spasms.   mometasone (NASONEX) 50 MCG/ACT nasal spray Place 2 sprays into the nose daily.   omeprazole (PRILOSEC) 40 MG capsule Take 40 mg by mouth daily.   pravastatin  (PRAVACHOL ) 20 MG tablet Take 1 tablet (20 mg total) by mouth daily.   tirzepatide (MOUNJARO) 2.5 MG/0.5ML Pen Inject  2.5 mg into the skin once a week.   tiZANidine  (ZANAFLEX ) 4 MG tablet Take 1 tablet (4 mg total) by mouth at bedtime.   triamcinolone acetonide (KENALOG-40) 40 MG/ML injection Inject 40 mg into the articular space once.   venlafaxine  (EFFEXOR ) 37.5 MG tablet Take 1 tablet (37.5 mg total) by mouth daily.   [DISCONTINUED] losartan-hydrochlorothiazide  (HYZAAR) 100-25 MG tablet Take 1 tablet by mouth daily.   [DISCONTINUED] metoprolol  succinate (TOPROL -XL) 50 MG 24 hr tablet Take 1 tablet (50 mg total) by mouth daily. Take with or immediately following a meal.     Allergies:   Meloxicam and Naproxen   Social History   Socioeconomic History   Marital status: Married    Spouse name: Not on file   Number of children: Not on file   Years of education: Not on file   Highest education level: Not on file  Occupational History   Not on file  Tobacco Use   Smoking status: Never   Smokeless tobacco: Never  Vaping Use   Vaping status: Never Used  Substance and Sexual Activity   Alcohol use: Never   Drug use: Never   Sexual activity: Not on file  Other Topics Concern   Not on file  Social History Narrative   Not on file   Social Drivers of Health   Financial Resource Strain: Low Risk  (10/29/2021)   [facility] prior to [facility]04/2022[facility]   Overall Financial Resource Strain (CARDIA)    Difficulty of Paying Living Expenses: Not very hard  Food Insecurity: Low Risk  (05/12/2022)   [facility]   Hunger Vital Sign    Worried About Running Out of Food in the Last Year: Never true    Ran Out of Food in the Last Year: Never true  Transportation Needs: No Transportation Needs (05/12/2022)   [facility]   Transportation    In the past 12 months, has lack of reliable transportation kept you from medical  appointments, meetings, work or from getting things needed for daily living? : No  Physical Activity: Inactive (10/29/2021)   [facility] visits prior to 05/04/2022[facility], [facility] visits prior to [facility]/04/2022[facility]   Exercise Vital Sign    Days of Exercise per Week: 0 days    Minutes of Exercise per Session: 0 min  Stress: Stress Concern Present (10/29/2021)   [facility], [facility] visits prior to 05/04/2022., [facility] prior to 05/04/2022[facility]   [employer] of Occupational Health - Occupational Stress Questionnaire    Feeling of Stress : To some extent  Social Connections: Moderately Integrated (10/29/2021)   Received from [facility] prior to 05/04/2022[facility], [facility] prior to 05/04/2022., [facility]   Social Connection and Isolation Panel [NHANES]    Frequency of Communication with Friends and Family: Once a week    Frequency of Social Gatherings with Friends and Family: Once a week    Attends Religious Services: 1 to 4 times per year    Active Member of [employer] or Organizations: Yes    Attends Banker Meetings: 1 to 4 times per year    Marital Status: Married     Family History: The patient's family history includes Colon cancer in her mother; Diabetes Mellitus II in her maternal aunt; Heart attack in her maternal uncle, maternal uncle, and maternal uncle; Heart disease in her maternal uncle and mother; Hypertension in her maternal uncle; Leukemia in her cousin; Lymphoma in her cousin; Ovarian cancer in her mother; Pulmonary embolism in her mother. ROS:   Please see the history of present illness.    All 14 point review of systems negative except as described per history of present illness.  EKGs/Labs/Other Studies Reviewed:    The following  studies were reviewed today:   EKG:  EKG Interpretation Date/Time:  Tuesday Jul 22 2023 14:48:28 EDT Ventricular Rate:  114 PR Interval:  144 QRS Duration:  74 QT Interval:  346 QTC Calculation: 476 R Axis:   68  Text Interpretation: Sinus tachycardia Nonspecific T wave abnormality No previous ECGs available Confirmed by Bertha Broad reddy [phone]) on 07/22/2023 3:04:22 PM    Recent Labs: 06/05/2023: ALT 25; BUN 9; Creat 0.84; Hemoglobin 13.3; Platelets 205; Potassium 4.0; Sodium 140; TSH 1.63  Recent Lipid Panel    Component Value Date/Time   CHOL 206 (H) 06/05/2023 0939   TRIG 85 06/05/2023 0939   HDL 94 06/05/2023 0939   CHOLHDL 2.2 06/05/2023 0939   LDLCALC 94 06/05/2023 0939    Physical Exam:    VS:  BP (!) 150/98   Pulse (!) 114   Ht 5\' 5"  (1.651 m)   Wt 213 lb (96.6 kg)   LMP 11/12/2010   SpO2 98%   BMI 35.45 kg/m     Wt Readings from Last 3 Encounters:  07/22/23 213 lb (96.6 kg)  06/05/23 211 lb 2 oz (95.8 kg)     GENERAL:  Well nourished, well developed in no acute distress NECK: No JVD; No carotid bruits CARDIAC: Regular rhythm, slightly tachycardic., S1 and S2 present, no murmurs, no rubs, no gallops CHEST:  Clear to auscultation without rales, wheezing or rhonchi  Extremities: No pitting pedal edema. Pulses bilaterally symmetric with radial 2+ and dorsalis pedis 2+ NEUROLOGIC:  Alert and oriented x 3  Medication Adjustments/Labs and Tests Ordered: Current medicines are reviewed at length with the patient today.  Concerns regarding medicines are outlined above.  Orders Placed This Encounter  Procedures   EKG 12-Lead   ECHOCARDIOGRAM COMPLETE   Meds ordered this encounter  Medications   carvedilol (COREG) 12.5 MG tablet    Sig: Take 1 tablet (12.5 mg total) by mouth 2 (two) times daily.    Dispense:  180 tablet    Refill:  3    Signed,  Laurie Penado reddy Kada Friesen, MD, MPH, [facility]. 07/22/2023 3:49 PM    [facility]

## 2023-07-22 NOTE — Assessment & Plan Note (Signed)
 Reports long history of sinus tachycardia.  No obvious cause. She appears to be relatively asymptomatic from this. Will switch from metoprolol  succinate to carvedilol. Advised to keep herself well-hydrated and reduce salt intake.  Defer to PCP to assess for noncardiac causes of sinus tachycardia.

## 2023-07-22 NOTE — Assessment & Plan Note (Signed)
 Uncontrolled blood pressures. Target blood pressure below 130/80 mmHg. Continue amlodipine  5 mg once daily. Reports she was previously on losartan-hydrochlorothiazide  but has not been taking this for a long time.  Will take this off her medication list.  Discontinue metoprolol  succinate switch to carvedilol 12.5 mg twice daily for better blood pressure control profile. Advised to keep herself well-hydrated. Advised to reduce salt intake.  Will obtain transthoracic echocardiogram for further cardiac structure and function assessment.

## 2023-07-22 NOTE — Assessment & Plan Note (Signed)
 Pain does not appear cardiac in description. She does have cardiovascular risk factors however has excellent functional status and denies any ongoing cardiac symptoms at this time.  Will obtain cardiac structure and function assessment with transthoracic echocardiogram given longstanding history of uncontrolled blood pressure. If no significant LV dysfunction or wall motion abnormalities, we will hold off on any further stress testing at this time.  If the nature of her symptoms change and more suggestive of cardiac causes, can consider further evaluation with stress test versus cardiac CT imaging.

## 2023-07-22 NOTE — Patient Instructions (Signed)
 Medication Instructions:  Your physician has recommended you make the following change in your medication:   START: Coreg 12.5 two times daily STOP:  Toprol   STOP: Hyzaar  *If you need a refill on your cardiac medications before your next appointment, please call your pharmacy*  Lab Work: None If you have labs (blood work) drawn today and your tests are completely normal, you will receive your results only by: MyChart Message (if you have MyChart) OR A paper copy in the mail If you have any lab test that is abnormal or we need to change your treatment, we will call you to review the results.  Testing/Procedures: Your physician has requested that you have an echocardiogram. Echocardiography is a painless test that uses sound waves to create images of your heart. It provides your doctor with information about the size and shape of your heart and how well your heart's chambers and valves are working. This procedure takes approximately one hour. There are no restrictions for this procedure. Please do NOT wear cologne, perfume, aftershave, or lotions (deodorant is allowed). Please arrive 15 minutes prior to your appointment time.  Please note: We ask at that you not bring children with you during ultrasound (echo/ vascular) testing. Due to room size and safety concerns, children are not allowed in the ultrasound rooms during exams. Our front office staff cannot provide observation of children in our lobby area while testing is being conducted. An adult accompanying a patient to their appointment will only be allowed in the ultrasound room at the discretion of the ultrasound technician under special circumstances. We apologize for any inconvenience.   Follow-Up: At [facility], you and your health needs are our priority.  As part of our continuing mission to provide you with exceptional heart care, our providers are all part of one team.  This team includes your primary Cardiologist  (physician) and Advanced Practice Providers or APPs (Physician Assistants and Nurse Practitioners) who all work together to provide you with the care you need, when you need it.  Your next appointment:   2 month(s)  Provider:   Bertha Broad, MD    We recommend signing up for the patient portal called "MyChart".  Sign up information is provided on this After Visit Summary.  MyChart is used to connect with patients for Virtual Visits (Telemedicine).  Patients are able to view lab/test results, encounter notes, upcoming appointments, etc.  Non-urgent messages can be sent to your provider as well.   To learn more about what you can do with MyChart, go to [email or web address].   Other Instructions None

## 2023-08-19 ENCOUNTER — Telehealth: Payer: Self-pay | Admitting: Family Medicine

## 2023-08-19 NOTE — Telephone Encounter (Signed)
 Left vm asking patient to return my call so we can see about scheduling her retinal eye screening.

## 2023-08-25 ENCOUNTER — Other Ambulatory Visit: Payer: Self-pay | Admitting: Family Medicine

## 2023-08-25 ENCOUNTER — Other Ambulatory Visit: Payer: Self-pay

## 2023-08-25 DIAGNOSIS — R0789 Other chest pain: Secondary | ICD-10-CM

## 2023-08-25 DIAGNOSIS — I10 Essential (primary) hypertension: Secondary | ICD-10-CM

## 2023-08-25 DIAGNOSIS — E1165 Type 2 diabetes mellitus with hyperglycemia: Secondary | ICD-10-CM

## 2023-08-25 DIAGNOSIS — R000 Tachycardia, unspecified: Secondary | ICD-10-CM

## 2023-08-25 DIAGNOSIS — E782 Mixed hyperlipidemia: Secondary | ICD-10-CM

## 2023-08-28 ENCOUNTER — Ambulatory Visit (HOSPITAL_BASED_OUTPATIENT_CLINIC_OR_DEPARTMENT_OTHER)
Admission: RE | Admit: 2023-08-28 | Discharge: 2023-08-28 | Disposition: A | Discharge status: Home / Self Care | Source: Ambulatory Visit

## 2023-08-28 DIAGNOSIS — R0789 Other chest pain: Secondary | ICD-10-CM | POA: Insufficient documentation

## 2023-08-30 ENCOUNTER — Ambulatory Visit: Payer: Self-pay

## 2023-08-30 LAB — ECHOCARDIOGRAM COMPLETE
AR max vel: 3.06 cm2
AV Area VTI: 3.03 cm2
AV Area mean vel: 3 cm2
AV Mean grad: 2 mmHg
AV Peak grad: 3.2 mmHg
Ao pk vel: 0.9 m/s
Area-P 1/2: 7.59 cm2
Calc EF: 66.3 %
S' Lateral: 2.3 cm
Single Plane A2C EF: 68.8 %
Single Plane A4C EF: 63.8 %

## 2023-09-01 NOTE — Progress Notes (Signed)
 Hematology/Oncology Follow-up Visit  Patient Name:  Ana Ruiz Date of Birth:  10-15-1965 Date of Encounter:  09/01/2023  Referring Provider:  No ref. provider found,   PCP:  Connie CHRISTELLA Emperor, MD  Diagnoses: 1.  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   2.  Maternal family history ovarian cancer.    Hematologic/Oncologic History: Aleicia Ruiz is a 58 y.o. female who is seen in consultation at the request of Emperor Connie Ip, *  for an evaluation of neutropenia, original date of consultation 12/05/2021.   Ms. Berwanger has a past medical history significant for hypertension and diabetes.  She saw her primary care provider, Connie Emperor, MD, on 10/30/2021 for routine follow-up.  As part of that evaluation, a CBC was obtained.  Her white blood cell count  was 3.9.  Differential revealed an ANC of 1.6.   In looking back through her past CBCs, she had an ANC of 1.7 in June 2023 and 1.5 in October 2022.  Prior to that, her neutrophil counts had remained within normal limits with the exception of an ANC of 1.7 in November 2017.   Ms. Chenette denied any reoccurring infections, no unintentional weight loss, no drenching night sweats, no abdominal pain or early satiety, and no new or enlarging lymph nodes.  She had to had no change to her medications over the 6 to 12 months prior  to her first low white blood cell count in 2023.   Her family history was significant for ovarian cancer in her mother.  She was diagnosed with ovarian cancer in her late 31s and passed away at 2 of complications related to a pulmonary embolism.  She also reportedly had a history of colon cancer.   Because of her unexplained neutropenia, she was referred to our office for consideration of further work-up and evaluation.  Interim Note:  [name] returns today for follow up of constitutional neutropenia.  This is a scheduled follow-up  visit.  [name] has had no reoccurring infections, hospitalizations for infection, or unexplained fevers.  No unintentional weight loss.  ECOG performance status 0.  Review of Systems: All other systems are negative.  Medications:   Current Outpatient Medications:  .  amitriptyline  (ELAVIL ) 50 mg tablet, TAKE 1 TABLET(50 MG) BY MOUTH AT BEDTIME, Disp: 30 tablet, Rfl: 0 .  amLODIPine  (NORVASC ) 10 mg tablet, Take 1 tablet (10 mg total) by mouth daily., Disp: 90 tablet, Rfl: 3 .  blood-glucose meter misc, Use as directed, Disp: 1 each, Rfl: 0 .  carvediloL  (COREG ) 12.5 mg tablet, Take 12.5 mg by mouth., Disp: , Rfl:  .  glucose blood (Precision Q-I-D Test) test strip, Check BS daily, Disp: 100 each, Rfl: 2 .  Lancets misc, Check blood sugar daily, Disp: 200 each, Rfl: 2 .  linaCLOtide  (Linzess ) 290 mcg cap capsule, Take 1 capsule (290 mcg total) by mouth daily. (Patient taking differently: Take 290 mcg by mouth daily as needed.), Disp: 90 capsule, Rfl: 1 .  methocarbamoL  (ROBAXIN ) 500 mg tablet, Take 500 mg by mouth daily as needed., Disp: , Rfl:  .  Mounjaro  2.5 mg/0.5 mL subcutaneous pen injector, Inject 2.5 mg under the skin every 7 days. Sundays. On hold  4/30 for surgery 07/08/23, Disp: , Rfl:  .  pravastatin  (PRAVACHOL ) 20 mg tablet, Take 1 tablet (20 mg total) by mouth daily., Disp: 90 tablet, Rfl: 1 .  venlafaxine  (EFFEXOR ) 37.5 mg tablet, Take 1 tablet (37.5 mg  total) by mouth 2 (two) times a day., Disp: 180 tablet, Rfl: 0  Past medical history, allergies, current medications, social history and family history were reviewed and updated when appropriate.  Objective:  BP (!) 160/91   Pulse 92   Temp 98.8 F (37.1 C) (Oral)   Resp 16   Wt 96.3 kg (212 lb 3.2 oz)   SpO2 98%   BMI 35.31 kg/m  General: Nontoxic-appearing, well spoken African-American female, no acute distress. Pulmonary: Clear to auscultation bilaterally. Cardiovascular: Regular rate and rhythm. Lymphatics: No  palpable lymphadenopathy in the cervical or supraclavicular regions.  Wt Readings from Last 5 Encounters:  09/01/23 96.3 kg (212 lb 3.2 oz)  07/30/23 98 kg (216 lb)  07/17/23 98.4 kg (216 lb 14.9 oz)  07/14/23 98.4 kg (217 lb)  07/08/23 98.4 kg (217 lb)    Results:   Results for orders placed or performed in visit on 09/01/23  CBC with Differential   Collection Time: 09/01/23  9:36 AM  Result Value Ref Range   WBC 3.32 (L) 4.40 - 11.00 10*3/uL   RBC 4.91 4.10 - 5.10 10*6/uL   Hemoglobin 13.3 12.3 - 15.3 g/dL   Hematocrit 59.8 64.0 - 44.6 %   Mean Corpuscular Volume (MCV) 81.6 80.0 - 96.0 fL   Mean Corpuscular Hemoglobin (MCH) 27.0 (L) 27.5 - 33.2 pg   Mean Corpuscular Hemoglobin Conc (MCHC) 33.1 33.0 - 37.0 g/dL   Red Cell Distribution Width (RDW) 13.6 12.3 - 17.0 %   Platelet Count (PLT) 208 150 - 450 10*3/uL   Mean Platelet Volume (MPV) 8.2 6.8 - 10.2 fL   Neutrophils % 36 %   Lymphocytes % 48 %   Monocytes % 12 %   Eosinophils % 3 %   Basophils % 1 %   Neutrophils Absolute 1.20 (L) 1.80 - 7.80 10*3/uL   Lymphocytes # 1.60 1.00 - 4.80 10*3/uL   Monocytes # 0.40 0.00 - 0.80 10*3/uL   Eosinophils # 0.10 0.00 - 0.50 10*3/uL   Basophils # 0.00 0.00 - 0.20 10*3/uL   Lab Results  Component Value Date   HGB 13.3 09/01/2023   HGB 13.7 07/30/2023   HGB 14.0 07/09/2023   HGB 13.3 12/18/2022   HGB 14.3 06/12/2022   HGB 13.5 05/14/2022   HGB 12.9 12/05/2021   HGB 13.1 10/30/2021   HGB 13.2 08/09/2021   HGB 13.3 12/20/2020   HGB 12.8 06/29/2020   Lab Results  Component Value Date   NEUTROABS 1.20 (L) 09/01/2023   NEUTROABS 2.00 07/30/2023   NEUTROABS 1.30 (L) 12/18/2022   NEUTROABS 2.00 06/12/2022   NEUTROABS 3.20 05/14/2022   NEUTROABS 1.7 (L) 12/05/2021   NEUTROABS 1.6 (L) 10/30/2021   NEUTROABS 1.7 (L) 08/09/2021   NEUTROABS 1.5 (L) 12/20/2020   NEUTROABS 2.6 06/29/2020   Lab Results  Component Value Date   FOLATE >24.0 12/05/2021   Radiology:   MRI Spine  Thoracic WO Contrast Narrative: CLINICAL DATA:  Bilateral shoulder pain beginning in early 2025. No known injury. History of prior cervical surgery.  EXAM: MRI THORACIC SPINE WITHOUT CONTRAST  TECHNIQUE: Multiplanar, multisequence MR imaging of the thoracic spine was performed. No intravenous contrast was administered.  COMPARISON:  MRI lumbar spine 09/16/2021 reviewed.  FINDINGS: Alignment:  Normal.  Vertebrae: No fracture, evidence of discitis, or bone lesion.  Cord:  Normal signal throughout.  Paraspinal and other soft tissues: Negative.  Disc levels:  T1-2: Negative.  T2-3: Negative.  T3-4: Negative.  T4-5: Negative.  T5-6:  Negative.  T6-7: Negative.  T7-8: Shallow right and left paracentral protrusions without stenosis are seen.  T8-9: Mild facet degenerative disease and minimal disc bulging. No stenosis.  T10-11: Shallow right paracentral protrusion and mild facet arthropathy. No stenosis.  T10-11: A central and right paracentral disc protrusion effaces the ventral thecal sac. The foramina are open.  T11-12: There is a shallow central protrusion without stenosis. This level is imaged in the sagittal plane only on the prior MRI and is unchanged in appearance.  T12-L1: Negative. Impression: A central and right paracentral protrusion at T10-11 effaces the ventral thecal sac.  Shallow central protrusion at T11-12 without stenosis appears unchanged since the prior MRI.  Electronically Signed   By: Debby Prader M.D.   On: 06/18/2023 12:03    Assessment and Plan:  1.  Duffy null associated neutrophil count: ANC is 1.2 today.  She is otherwise doing well.  I will see her back in a year and if everything looks good, and her ANC remains above 1.0, I will discharge her back to her primary care provider for ongoing follow-up.  There would be no indication for a bone marrow biopsy at this time.  We reviewed the natural history and biology of Duffy null  associated neutrophil count.  If her ANC falls below 1 on a consistent basis, or if she develops other cytopenias, a bone marrow biopsy will be indicated.  Questions were answered today to the best my ability.  In the interim, she was encouraged to call if questions, concerns, or problems arise.  In total, 25 minutes of time was spent on this encounter, greater than 50% of which was spent face-to-face with Arnulfo Rosaline Kirsch for the history, physical exam, assessment and formulation of treatment plan.  This document was created using the aid of voice recognition Dragon dictation software, please excuse any sound alike substitutions, typographic or transcription errors.  Efforts have been made to correct these dictation errors, however some may persist, and this does not reflect the standard of medical care.  If there are any questions please do not hesitate to contact me for clarification.

## 2023-09-01 NOTE — Progress Notes (Signed)
 PHYSIATRY SPINE & MSK OUTPATIENT EVALUATION [facility]  Ana Ruiz is a 58 y.o. year old female seen for chief complaint of neck/shoulder pain.  Patient is referral from Dr. Bufford with orthopedic spine surgery.     HPI:  58 year old female with a past medical history of DM2, HTN, GERD, obesity, neutropenia, prior C4-6 ACDF, and prior lumbar surgery who presents for evaluation of above.  She reports 6 months history of neck pain that is constant regardless of her position.  She describes her pain as sharp and a tightness with spasms/cramps in her neck.  Pain is not localized to 1 side and can shift quickly from one side to the other.  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  Her neck surgery was done in 2023 by Dr. Rogelio in [place]. Dr. Bufford has evaluated her and does not seen anything surgical. She underwent updated MRI's of Thoracic and cervical spine which do not show any significant neural compression. Her pain today is mainly located over her left greater than right shoulder area.  She reports pain is mainly worse when she is having to lift things overhead.  Her current pain is an 8/10 and worse at the end of the day.  She denies any radicular symptoms today.  She denies any upper extremity weakness or other red flag symptoms.  She continues on Robaxin  for pain.  She has not done any physical therapy.  Red flags: none; no bowel or urinary incontinence, saddle anesthesia, fevers, unexpected weight loss  Prior Treatments: Robaxin , Tylenol No PT  Past Medical History:  Medical History[1]  Past Surgical History:  Surgical History[2]  Social History:     Allergies:  Allergies[3]  Current Medications: @HOMEMEDS @  PMH, PSH, Medications, Allergies, SH, FH, 14 point ROS were reviewed by me today.  Patient has been instructed to followup with PCP  or appropriate specialist as required for symptoms outside the purview of this speciality.  Lab Results  Component Value Date   CREATININE 0.83 07/09/2023    PHYSICAL EXAMINATION: Vitals:   09/01/23 1418 09/01/23 1420  BP: (!) 162/104 157/81  Pulse: 84   Resp: 16   SpO2: 99%   Weight: 96.2 kg (212 lb)   Height: 1.651 m (5' 5)     GENERAL: A/O x3, cooperative, NAD PSYCH: Mood and affect appropriate. SKIN: No rashes or lesions noted.  PULM: Respirations regular, normal work of breathing on room air.   NEURO:  - No focal UE weakness, (5/5 strength throughout bilaterally). - No UE hyper-reflexia - No focal sensory deficits to light touch.   MSK Cervical spine:  - normal inspection - Palpation: TTP over the left posterior shoulder and left trapezius - ROM: Pain with cervical rotation bilaterally.  No pain with flexion or extension -Spurling's: Negative -Hoffmann's: Negative  Left shoulder: - Normal inspection - Palpation: TTP over the posterior shoulder area - Range of motion: Decreased internal/external range of motion.  Full flexion and abduction.  Abduction does reproduce pain - Positive empty can, positive Vonzell Berber, positive Hornblower's.  Negative scarf  Radiology:  MRI cervical spine 05/08/2023 IMPRESSION: 1. Previous ACDF C4 through C6 with apparent solid union and sufficient patency of the canal and foramina. 2. Adjacent segment degenerative disease at C3-4 and C6-7 with endplate osteophytes and bulging of the disc. Narrowing of the ventral subarachnoid space but no compressive effect upon  the cord. No significant foraminal narrowing. Findings could relate to neck pain. 3. Mild facet osteoarthritis at C7-T1.  MRI thoracic spine 06/18/23 IMPRESSION: A central and right paracentral protrusion at T10-11 effaces the ventral thecal sac.   Shallow central protrusion at T11-12 without stenosis appears unchanged since the prior MRI.  X-ray left shoulder  03/28/2023 IMPRESSION: 1.  No acute fracture.  2.  No malalignment. 3.  Mild AC joint degenerative changes.  Impression:  1.  Left-sided neck/shoulder pain.  Patient with negative MRI cervical and thoracic spine.  Positive reproduction of symptoms with shoulder range of motion as well as rotator cuff testing 2.  Prior C4-6 ACDF 3.  Prior lumbar surgery 4. Pmhx of DM2  Patient presents for 69-month history of left-sided neck and shoulder pain.  She is seen orthopedic spine surgery and underwent updated MRIs which do not show any significant compression and intact cervical fusion.  Her pain today is mainly with shoulder activation and movement revealing underlying likely shoulder pathology.  She also had positive rotator cuff and bursitis testing likely contributing.  She has not done any physical therapy is only undergone medications for this.  Will refer patient to physical therapy.  If she does not improve, can consider left shoulder injection versus obtaining imaging including potentially MRI of her left shoulder.  Plan -Referral to physical therapy - Consider creams and rubs including Voltaren - Consider injection to left shoulder (left shoulder versus subacromial bursa) -Consider obtaining MRI of left shoulder if no improvement.  Next steps: - We will see the patient for follow-up as needed. Patient will reach out to office if no improvement for potential injection and obtaining updated imaging. Encouraged to call with any questions or concerns.  Electronically signed by:  Morene Debby Quin, DO 09/01/2023 9:47 AM  Patient was seen and examined with Dr.Buterbaugh.  I personally obtained the history, examined the patient, and formulated the treatment plan as indicated above.  I agree with the above note and have modified it, where appropriate.  Alm Franklin Raddle., MD       [1] Past Medical History: Diagnosis Date  . Anxiety Unknown  . Arthritis Unknown  . Benign essential  hypertension   . DDD (degenerative disc disease), lumbar   . Depression Know  . Diabetes mellitus type II, controlled    (CMD)   . Diverticulitis of colon   . Diverticulosis   . Family history of colon cancer   . Gastroesophageal reflux disease without esophagitis   . Genetic testing 01/03/2022   Negative 84 gene Multi-Cancer [employer] Panel. VUS in ATM, AXIN2, POLD1x2  . Hypercholesteremia   . Hyperlipidemia   . IBS (irritable bowel syndrome)   . Inflammatory bowel disease Unknown  . Insomnia   . Lumbar spondylosis   . Obesity due to excess calories with serious comorbidity   . Persistent headaches   . Plantar fasciitis, bilateral   . PONV (postoperative nausea and vomiting)   [2] Past Surgical History: Procedure Laterality Date  . ANTERIOR CERVICAL DISCECTOMY W/ FUSION     Procedure: ANTERIOR CERVICAL DISCECTOMY W/ FUSION  . CHEILECTOMY Right 05/04/2021   Procedure: CHEILECTOMY (FOOT);  Surgeon: Dekarlos Megail Dial, DPM;  Location: [facility];  Service: Podiatry;  Laterality: Right;  c-arm, sagittal bone saw  . COLONOSCOPY     Procedure: COLONOSCOPY  . DILATION AND CURETTAGE OF UTERUS    . HYSTEROSCOPY N/A 08/13/2022   HYSTEROSCOPY WITH DILATATION AND CURETTAGE, MYOSURE performed by Vicenta MARLA Ligas,  MD at [facility] OR  . ROBOTIC ASSISTED HYSTERECTOMY Bilateral 07/08/2023   HYSTERECTOMY ROBOTIC XI WITH SALPINGO-OOPHORECTOMY ROBOTIC XI performed by Vicenta MARLA Ligas, MD at [facility] OR  . TUBAL LIGATION    [3] Allergies Allergen Reactions  . Naproxen Palpitations and GI Intolerance  . Meloxicam GI Intolerance

## 2023-09-08 ENCOUNTER — Encounter: Payer: Self-pay | Admitting: Family Medicine

## 2023-09-08 ENCOUNTER — Ambulatory Visit: Admitting: Family Medicine

## 2023-09-08 VITALS — BP 122/78 | HR 90 | Temp 98.3°F | Ht 65.0 in | Wt 212.2 lb

## 2023-09-08 DIAGNOSIS — Z23 Encounter for immunization: Secondary | ICD-10-CM | POA: Diagnosis not present

## 2023-09-08 DIAGNOSIS — K5909 Other constipation: Secondary | ICD-10-CM

## 2023-09-08 DIAGNOSIS — Z981 Arthrodesis status: Secondary | ICD-10-CM

## 2023-09-08 DIAGNOSIS — E1165 Type 2 diabetes mellitus with hyperglycemia: Secondary | ICD-10-CM

## 2023-09-08 DIAGNOSIS — M542 Cervicalgia: Secondary | ICD-10-CM

## 2023-09-08 DIAGNOSIS — E785 Hyperlipidemia, unspecified: Secondary | ICD-10-CM

## 2023-09-08 DIAGNOSIS — G4709 Other insomnia: Secondary | ICD-10-CM

## 2023-09-08 DIAGNOSIS — F419 Anxiety disorder, unspecified: Secondary | ICD-10-CM

## 2023-09-08 DIAGNOSIS — Z7985 Long-term (current) use of injectable non-insulin antidiabetic drugs: Secondary | ICD-10-CM

## 2023-09-08 DIAGNOSIS — I10 Essential (primary) hypertension: Secondary | ICD-10-CM

## 2023-09-08 MED ORDER — TIZANIDINE HCL 4 MG PO TABS: 4.0000 mg | ORAL_TABLET | Freq: Every day | ORAL | 1 refills | Status: AC

## 2023-09-08 MED ORDER — LINACLOTIDE 145 MCG PO CAPS: 145.0000 ug | ORAL_CAPSULE | Freq: Every day | ORAL | 1 refills | Status: DC

## 2023-09-08 MED ORDER — AMLODIPINE BESYLATE 5 MG PO TABS: 5.0000 mg | ORAL_TABLET | Freq: Every day | ORAL | 1 refills | Status: DC

## 2023-09-08 MED ORDER — DOXEPIN HCL 10 MG PO CAPS: 10.0000 mg | ORAL_CAPSULE | Freq: Every evening | ORAL | 1 refills | Status: AC | PRN

## 2023-09-08 MED ORDER — VENLAFAXINE HCL 37.5 MG PO TABS: 37.5000 mg | ORAL_TABLET | Freq: Every day | ORAL | 1 refills | Status: DC

## 2023-09-08 MED ORDER — METHOCARBAMOL 500 MG PO TABS: 500.0000 mg | ORAL_TABLET | Freq: Three times a day (TID) | ORAL | 1 refills | Status: AC | PRN

## 2023-09-08 MED ORDER — LINACLOTIDE 290 MCG PO CAPS: 290.0000 ug | ORAL_CAPSULE | Freq: Every day | ORAL | 1 refills | Status: AC

## 2023-09-08 MED ORDER — PRAVASTATIN SODIUM 20 MG PO TABS
20.0000 mg | ORAL_TABLET | Freq: Every day | ORAL | 1 refills | Status: DC
Start: 2023-09-08 — End: 2023-09-22

## 2023-09-08 NOTE — Progress Notes (Signed)
 Ana Ruiz Office Visit  Assessment & Plan:  Type 2 diabetes mellitus with hyperglycemia, without long-term current use of insulin (HCC)  Other insomnia -     Doxepin  HCl; Take 1 capsule (10 mg total) by mouth at bedtime as needed.  Dispense: 90 capsule; Refill: 1  Benign essential hypertension -     amLODIPine  Besylate; Take 1 tablet (5 mg total) by mouth daily.  Dispense: 90 tablet; Refill: 1  Neck pain, bilateral -     Methocarbamol ; Take 1 tablet (500 mg total) by mouth every 8 (eight) hours as needed for muscle spasms.  Dispense: 90 tablet; Refill: 1 -     tiZANidine  HCl; Take 1 tablet (4 mg total) by mouth at bedtime.  Dispense: 90 tablet; Refill: 1  S/P cervical spinal fusion -     Methocarbamol ; Take 1 tablet (500 mg total) by mouth every 8 (eight) hours as needed for muscle spasms.  Dispense: 90 tablet; Refill: 1 -     tiZANidine  HCl; Take 1 tablet (4 mg total) by mouth at bedtime.  Dispense: 90 tablet; Refill: 1  Hyperlipidemia, unspecified hyperlipidemia type -     Pravastatin  Sodium; Take 1 tablet (20 mg total) by mouth daily.  Dispense: 90 tablet; Refill: 1  Anxiety -     Venlafaxine  HCl; Take 1 tablet (37.5 mg total) by mouth daily.  Dispense: 90 tablet; Refill: 1  Other constipation -     linaCLOtide ; Take 1 capsule (290 mcg total) by mouth daily before breakfast.  Dispense: 90 capsule; Refill: 1  Need for shingles vaccine -     Varicella-zoster vaccine IM   Assessment and Plan    Post-hysterectomy symptoms Increased hot flashes and insomnia post-hysterectomy. Not on hormone therapy. No follow-up with Dr. Larose post-surgery. - Consider trazodone or doxepin  for sleep. - Send note to Dr. Larose regarding hot flashes.  Constipation Constipation likely due to surgery, anesthesia, and Mounjaro . Ineffective response to citric magnesia and Linzess . - Ensure fiber intake of 25-30 grams daily. - Increase water intake to 6-8 glasses daily.  Type 2 Diabetes  Mellitus Good glycemic control with Mounjaro . Weight reduced to 212 lbs. Mounjaro  effective in reducing cravings.  Hypertension Well-controlled with carvedilol  and amlodipine . Normal cardiac function on echocardiogram.  General Health Maintenance Up to date with colonoscopy. Scheduled for mammogram. Due for Shingrix  and tetanus vaccines. - Administer second Shingrix  vaccine. - Schedule mammogram. - Administer tetanus vaccine next year.     Will do labs next time Recommend healthy diet i.e mediterranean/DASH diet, consistent exercise - 30 minutes 5 day per week, and gradual weight loss.    Return in about 4 months (around 01/09/2024), or if symptoms worsen or fail to improve, for hypertension, type 2 diabetes, hyperlipidemia.   Subjective:    Ana Ruiz ID: Ana Ruiz, female    DOB: [birth year]  Age: 58 y.o. MRN: [id number]  Chief Complaint  Ana Ruiz presents with   Medical Management of Chronic Issues    HPI Discussed the use of AI scribe software for clinical note transcription with the Ana Ruiz, who gave verbal consent to proceed.  History of Present Illness   Ana Ruiz is a 58 year old female who presents with worsening hot flashes and constipation following a hysterectomy in May.  She underwent a hysterectomy in May and stayed in the hospital for one day post-surgery. Since the surgery, she has experienced worsening hot flashes and constipation.  Her hot flashes have become more pronounced, significantly impacting her sleep.  Despite taking amitriptyline  at night, she finds herself awake at midnight, indicating the medication is not effective. She has not tried other medications for sleep.  She experiences constipation, having bowel movements approximately every third day. She has tried using citric magnesia, which required a bottle and a half to be effective, and Linzess , which previously worked but is no longer effective. She is unsure if she is consuming enough fiber  and reports a decreased appetite, possibly due to Mounjaro , which she is tolerating well without nausea or vomiting.  She has lost weight, currently weighing 212 pounds, down from a high of 220 pounds, attributing this to Mounjaro , which has reduced her cravings. Her blood pressure is managed with Coreg  and amlodipine , with no symptoms such as headaches or palpitations. Her cardiologist informed her that her echocardiogram in June was good and showed normal function.  She drinks about five to six glasses of water per day and consumes a glass or two of wine weekly. She cooks at home using meal kits that provide portion-controlled ingredients.     Ana Ruiz is also here to discuss chronic medical issues.  HTN-using antihypertensive medication without difficulty.  Denies associated signs and symptoms including chest pain, shortness of breath, cough headache, peripheral swelling cramps spasms and palpitations.   Blood pressures at home are less than 140/90.   Hyperlipidemia-denies unusual muscle aches or muscle cramps or difficulty tolerating statin therapy.  Aware of need for diet control, exercise and healthy eating.  Ana Ruiz is aware not to consume grapefruit juice or pomegranate juice.  LABS WBC: Low Blood Glucose: 90 (09/08/2023)  DIAGNOSTIC Echocardiogram: Normal function, no valve abnormalities (08/2023) Assessment & Plan Post-hysterectomy symptoms Increased hot flashes and insomnia post-hysterectomy. Not on hormone therapy. No follow-up with Dr. Larose post-surgery. - Consider trazodone or doxepin  for sleep. - Send note to Dr. Larose regarding hot flashes.  Constipation Constipation likely due to surgery, anesthesia, and Mounjaro . Ineffective response to citric magnesia and Linzess . - Ensure fiber intake of 25-30 grams daily. - Increase water intake to 6-8 glasses daily.  Type 2 Diabetes Mellitus Good glycemic control with Mounjaro . Weight reduced to 212 lbs. Mounjaro  effective in  reducing cravings.  Hypertension Well-controlled with carvedilol  and amlodipine . Normal cardiac function on echocardiogram. Ana Ruiz has upcoming cardiology appt  General Health Maintenance Up to date with colonoscopy. Scheduled for mammogram. Due for Shingrix  and tetanus vaccines. - Administer second Shingrix  vaccine. - Schedule mammogram. - Administer tetanus vaccine next year.    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     High cholesterol    Hyperlipidemia 08/09/2014   Hypertension    Insomnia 02/07/2017   Intramural and submucous leiomyoma of uterus 04/17/2023   Neck pain, bilateral 12/26/2015   Need for shingles vaccine 06/05/2023   Obesity (BMI 30-39.9) 05/16/2014   Obesity due to excess calories with serious comorbidity 05/16/2014  Other constipation 05/11/2015   Persistent headaches 12/21/2013   Last Assessment & Plan:   Start Elavil  25mg  PO QHS. RTC for CPE in the next few weeks.     Plantar fasciitis, bilateral 02/06/2015   Postmenopausal bleeding 04/17/2023   Preventative health care 01/09/2016   S/P cervical spinal fusion 04/15/2023   S/P foot surgery, right 05/09/2021   Strain of thoracic spine 12/26/2015   Type 2 diabetes mellitus with hyperglycemia, without long-term current use of insulin (HCC) 07/27/2019   Past Surgical History:   Procedure Laterality Date   BACK SURGERY     ANTERIOR CERVICAL DISCECTOMY W/ FUSION   FOOT SURGERY Right 05/04/2021   Dr Joaquim- CHEILECTOMY   HYSTEROSCOPY WITH D & C  08/13/2022   Dr Kathy   TUBAL LIGATION     Social History   Tobacco Use   Smoking status: Never   Smokeless tobacco: Never  Vaping Use   Vaping status: Never Used  Substance Use Topics   Alcohol use: Never   Drug use: Never   Family History  Problem Relation Age of Onset   Colon cancer Mother    Ovarian cancer Mother    Heart disease Mother    Pulmonary embolism Mother    Hypertension Mother    Diabetes Mellitus II Maternal Aunt    Hypertension Maternal Uncle    Heart disease Maternal Uncle    Heart attack Maternal Uncle    Heart attack Maternal Uncle    Heart attack Maternal Uncle    Leukemia Cousin    Lymphoma Cousin    Allergies  Allergen Reactions   Meloxicam Nausea And Vomiting   Naproxen Nausea Only    CP    ROS    Objective:    BP 122/78   Pulse 90   Temp 98.3 F (36.8 C)   Ht 5' 5 (1.651 m)   Wt 212 lb 4 oz (96.3 kg)   LMP 11/12/2010   SpO2 98%   BMI 35.32 kg/m  BP Readings from Last 3 Encounters:  09/08/23 122/78  07/22/23 (!) 150/98  06/05/23 130/84   Wt Readings from Last 3 Encounters:  09/08/23 212 lb 4 oz (96.3 kg)  07/22/23 213 lb (96.6 kg)  06/05/23 211 lb 2 oz (95.8 kg)    Physical Exam Vitals and nursing note reviewed.  Constitutional:      Appearance: Normal appearance.  HENT:     Head: Normocephalic.     Right Ear: Tympanic membrane, ear canal and external ear normal.     Left Ear: Tympanic membrane, ear canal and external ear normal.  Eyes:     Extraocular Movements: Extraocular movements intact.     Pupils: Pupils are equal, round, and reactive to light.  Cardiovascular:     Rate and Rhythm: Normal rate and regular rhythm.     Heart sounds: Normal heart sounds.  Pulmonary:     Effort: Pulmonary effort is normal.     Breath sounds: Normal breath  sounds.  Abdominal:     General: Bowel sounds are normal.     Tenderness: There is no abdominal tenderness.  Musculoskeletal:     Right lower leg: No edema.     Left lower leg: No edema.  Neurological:     General: No focal deficit present.     Mental Status: She is alert and oriented to person, place, and time.  Psychiatric:        Mood and Affect: Mood normal.  Behavior: Behavior normal.      No results found for any visits on 09/08/23.

## 2023-09-10 LAB — HM MAMMOGRAPHY

## 2023-09-15 ENCOUNTER — Encounter: Payer: Self-pay | Admitting: Family Medicine

## 2023-09-22 ENCOUNTER — Ambulatory Visit

## 2023-09-22 VITALS — BP 138/80 | HR 90 | Ht 65.0 in | Wt 209.0 lb

## 2023-09-22 DIAGNOSIS — E782 Mixed hyperlipidemia: Secondary | ICD-10-CM | POA: Diagnosis not present

## 2023-09-22 DIAGNOSIS — I10 Essential (primary) hypertension: Secondary | ICD-10-CM

## 2023-09-22 MED ORDER — ROSUVASTATIN CALCIUM 10 MG PO TABS: 10.0000 mg | ORAL_TABLET | Freq: Every day | ORAL | 3 refills | Status: AC

## 2023-09-22 NOTE — Patient Instructions (Signed)
 Medication Instructions:  Your physician has recommended you make the following change in your medication:   Stop Pravastatin   Start Crestor  10 mg daily  *If you need a refill on your cardiac medications before your next appointment, please call your pharmacy*   Lab Work: None ordered If you have labs (blood work) drawn today and your tests are completely normal, you will receive your results only by: MyChart Message (if you have MyChart) OR A paper copy in the mail If you have any lab test that is abnormal or we need to change your treatment, we will call you to review the results.   Testing/Procedures: None ordered   Follow-Up: At [facility], you and your health needs are our priority.  As part of our continuing mission to provide you with exceptional heart care, we have created designated Provider Care Teams.  These Care Teams include your primary Cardiologist (physician) and Advanced Practice Providers (APPs -  Physician Assistants and Nurse Practitioners) who all work together to provide you with the care you need, when you need it.  We recommend signing up for the patient portal called MyChart.  Sign up information is provided on this After Visit Summary.  MyChart is used to connect with patients for Virtual Visits (Telemedicine).  Patients are able to view lab/test results, encounter notes, upcoming appointments, etc.  Non-urgent messages can be sent to your provider as well.   To learn more about what you can do with MyChart, go to [email or web address].    Your next appointment:   6 month(s)  The format for your next appointment:   In Person  Provider:   Alean Kobus, MD    Other Instructions none  Important Information About Sugar

## 2023-09-22 NOTE — Assessment & Plan Note (Signed)
 Lipid panel from June 05, 2023 noted total cholesterol 206, HDL 94, LDL 94, triglycerides 85. Continue lipid-lowering therapy with statins. With current dose of pravastatin  she is experiencing muscle aches. Will switch to alternative statin and try rosuvastatin  10 mg once daily.

## 2023-09-22 NOTE — Progress Notes (Signed)
 Cardiology Consultation:    Date:  09/22/2023   ID:  [name], DOB [birth year], MRN [id number]  PCP:  Aletha Bene, MD  Cardiologist:  Alean SAUNDERS Sheldon Sem, MD   Referring MD: Aletha Bene, MD   Chief Complaint  Patient presents with   Follow-up     ASSESSMENT AND PLAN:   Ms. Arcos 58 year old woman with history of hypertension, hyperlipidemia, elevated heart rates at baseline, diabetes mellitus, C-spine surgery in 2013 and recent hysterectomy 07/08/2023 for postmenopausal bleeding.  Reporting muscle aches with current dose of pravastatin . Does not smoke, consume alcohol or use illicit drugs Echocardiogram from June 2025 noted normal biventricular function without significant valve abnormalities.  Here for follow-up visit.  Problem List Items Addressed This Visit     Benign essential hypertension - Primary   Improved control. Target below 130/80 mmHg. Mentions has been well-controlled at home. Continue current doses. Amlodipine  5 mg once daily and carvedilol  12.5 mg twice daily.  Tolerating well without any side effects.      Relevant Medications   rosuvastatin  (CRESTOR ) 10 MG tablet   Hyperlipidemia   Lipid panel from June 05, 2023 noted total cholesterol 206, HDL 94, LDL 94, triglycerides 85. Continue lipid-lowering therapy with statins. With current dose of pravastatin  she is experiencing muscle aches. Will switch to alternative statin and try rosuvastatin  10 mg once daily.       Relevant Medications   rosuvastatin  (CRESTOR ) 10 MG tablet   Return to clinic tentatively in 6 months.   History of Present Illness:    EMILY MASSAR is a 58 y.o. female who is being seen today for follow-up visit. PCP is Aletha Bene, MD. Last visit with me in the office was 07-22-2023.  Here for the visit by herself.  Works in Engineering geologist.  history of hypertension, hyperlipidemia, elevated heart rates at baseline, diabetes mellitus, C-spine surgery in 2013 and  recent hysterectomy 07/08/2023 for postmenopausal bleeding.  Reporting muscle aches with current dose of pravastatin . Does not smoke, consume alcohol or use illicit drugs Echocardiogram from June 2025 noted normal biventricular function without significant valve abnormalities.  Was seen for evaluation in the neck and shoulder discomfort.  Did not appear cardiac in nature.  Blood pressures were noted to be elevated.  Overall no active symptoms at this time. Mentions she has been losing weight and noticed blood pressures at home have been well-controlled highest systolic blood pressure she has noted at home was 135 mmHg. She has been losing weight and currently on her second month using Mounjaro .   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     High cholesterol    Hyperlipidemia 08/09/2014   Hypertension    Insomnia 02/07/2017   Intramural and submucous leiomyoma of uterus 04/17/2023   Neck pain, bilateral 12/26/2015   Need for shingles vaccine 06/05/2023   Obesity (BMI 30-39.9) 05/16/2014   Obesity due to excess calories with serious comorbidity  05/16/2014   Other constipation 05/11/2015   Persistent headaches 12/21/2013   Last Assessment & Plan:   Start Elavil  25mg  PO QHS. RTC for CPE in the next few weeks.     Plantar fasciitis, bilateral 02/06/2015   Postmenopausal bleeding 04/17/2023   Preventative health care 01/09/2016    S/P cervical spinal fusion 04/15/2023   S/P foot surgery, right 05/09/2021   Strain of thoracic spine 12/26/2015   Type 2 diabetes mellitus with hyperglycemia, without long-term current use of insulin (HCC) 07/27/2019    Past Surgical History:  Procedure Laterality Date   BACK SURGERY     ANTERIOR CERVICAL DISCECTOMY W/ FUSION   FOOT SURGERY Right 05/04/2021   Dr Joaquim- CHEILECTOMY   HYSTEROSCOPY WITH D & C  08/13/2022   Dr Kathy   TUBAL LIGATION      Current Medications: Current Meds  Medication Sig   amLODipine  (NORVASC ) 5 MG tablet Take 1 tablet (5 mg total) by mouth daily.   Blood Glucose Monitoring Suppl (GLUCOCOM BLOOD GLUCOSE MONITOR) DEVI 1 each by Other route See admin instructions.   carvedilol  (COREG ) 12.5 MG tablet Take 1 tablet (12.5 mg total) by mouth 2 (two) times daily.   doxepin  (SINEQUAN ) 10 MG capsule Take 1 capsule (10 mg total) by mouth at bedtime as needed.   glucose blood (PRECISION QID TEST) test strip 1 each by Other route as directed.   linaclotide  (LINZESS ) 290 MCG CAPS capsule Take 1 capsule (290 mcg total) by mouth daily before breakfast.   methocarbamol  (ROBAXIN ) 500 MG tablet Take 1 tablet (500 mg total) by mouth every 8 (eight) hours as needed for muscle spasms.   MOUNJARO  2.5 MG/0.5ML Pen ADMINISTER 2.5 MG UNDER THE SKIN 1 TIME A WEEK   rosuvastatin  (CRESTOR ) 10 MG tablet Take 1 tablet (10 mg total) by mouth daily.   tiZANidine  (ZANAFLEX ) 4 MG tablet Take 1 tablet (4 mg total) by mouth at bedtime.   venlafaxine  (EFFEXOR ) 37.5 MG tablet Take 1 tablet (37.5 mg total) by mouth daily.   [DISCONTINUED] pravastatin  (PRAVACHOL ) 20 MG tablet Take 1 tablet (20 mg total) by mouth daily.     Allergies:   Meloxicam and Naproxen   Social History   Socioeconomic History   Marital status: Married    Spouse name: Not on file   Number of children: Not on file   Years of education: Not on file   Highest education level: Associate degree: occupational,  Scientist, product/process development, or vocational program  Occupational History   Not on file  Tobacco Use   Smoking status: Never   Smokeless tobacco: Never  Vaping Use   Vaping status: Never Used  Substance and Sexual Activity   Alcohol use: Never   Drug use: Never   Sexual activity: Not on file  Other Topics Concern   Not on file  Social History Narrative   Not on file   Social Drivers of Health   Financial Resource Strain: Low Risk  (09/04/2023)   Overall Financial Resource Strain (CARDIA)    Difficulty of Paying Living Expenses: Not hard at all  Food Insecurity: No Food Insecurity (09/04/2023)   Hunger Vital Sign    Worried About Running Out of Food in the Last Year: Never true    Ran Out of Food in the Last Year: Never true  Transportation Needs: No Transportation Needs (09/04/2023)   PRAPARE - Administrator, Civil Service (Medical): No    Lack of Transportation (Non-Medical): No  Physical  Activity: Insufficiently Active (09/04/2023)   Exercise Vital Sign    Days of Exercise per Week: 3 days    Minutes of Exercise per Session: 30 min  Stress: Stress Concern Present (09/04/2023)   [employer] of Occupational Health - Occupational Stress Questionnaire    Feeling of Stress: To some extent  Social Connections: Socially Integrated (09/04/2023)   Social Connection and Isolation Panel    Frequency of Communication with Friends and Family: Three times a week    Frequency of Social Gatherings with Friends and Family: Twice a week    Attends Religious Services: More than 4 times per year    Active Member of [employer] or Organizations: Yes    Attends Engineer, structural: More than 4 times per year    Marital Status: Married     Family History: The patient's family history includes Colon cancer in her mother; Diabetes Mellitus II in her maternal aunt; Heart attack in her maternal uncle, maternal uncle, and maternal uncle; Heart disease in her maternal uncle and mother; Hypertension in  her maternal uncle and mother; Leukemia in her cousin; Lymphoma in her cousin; Ovarian cancer in her mother; Pulmonary embolism in her mother. ROS:   Please see the history of present illness.    All 14 point review of systems negative except as described per history of present illness.  EKGs/Labs/Other Studies Reviewed:    The following studies were reviewed today:   EKG:       Recent Labs: 06/05/2023: ALT 25; BUN 9; Creat 0.84; Hemoglobin 13.3; Platelets 205; Potassium 4.0; Sodium 140; TSH 1.63  Recent Lipid Panel    Component Value Date/Time   CHOL 206 (H) 06/05/2023 0939   TRIG 85 06/05/2023 0939   HDL 94 06/05/2023 0939   CHOLHDL 2.2 06/05/2023 0939   LDLCALC 94 06/05/2023 0939    Physical Exam:    VS:  BP 138/80   Pulse 90   Ht 5' 5 (1.651 m)   Wt 209 lb (94.8 kg)   LMP 11/12/2010   SpO2 99%   BMI 34.78 kg/m     Wt Readings from Last 3 Encounters:  09/22/23 209 lb (94.8 kg)  09/08/23 212 lb 4 oz (96.3 kg)  07/22/23 213 lb (96.6 kg)     GENERAL:  Well nourished, well developed in no acute distress NECK: No JVD; No carotid bruits CARDIAC: RRR, S1 and S2 present, no murmurs, no rubs, no gallops Extremities: No pitting pedal edema. Pulses bilaterally symmetric with radial 2+ and dorsalis pedis 2+ NEUROLOGIC:  Alert and oriented x 3  Medication Adjustments/Labs and Tests Ordered: Current medicines are reviewed at length with the patient today.  Concerns regarding medicines are outlined above.  No orders of the defined types were placed in this encounter.  Meds ordered this encounter  Medications   rosuvastatin  (CRESTOR ) 10 MG tablet    Sig: Take 1 tablet (10 mg total) by mouth daily.    Dispense:  90 tablet    Refill:  3    Signed, Shaya Altamura reddy Alysse Rathe, MD, MPH, [facility]. 09/22/2023 12:25 PM    [facility]

## 2023-09-22 NOTE — Assessment & Plan Note (Signed)
 Improved control. Target below 130/80 mmHg. Mentions has been well-controlled at home. Continue current doses. Amlodipine  5 mg once daily and carvedilol  12.5 mg twice daily.  Tolerating well without any side effects.

## 2023-10-18 ENCOUNTER — Other Ambulatory Visit: Payer: Self-pay | Admitting: Family Medicine

## 2023-10-18 DIAGNOSIS — E1165 Type 2 diabetes mellitus with hyperglycemia: Secondary | ICD-10-CM

## 2023-10-21 NOTE — Telephone Encounter (Signed)
 Requested medication (s) are due for refill today -yes  Requested medication (s) are on the active medication list -yes  Future visit scheduled -yes  Last refill: 08/25/23 2ml 1RF  Notes to clinic: off protocol- provider review   Requested Prescriptions  Pending Prescriptions Disp Refills   MOUNJARO  2.5 MG/0.5ML Pen [Pharmacy Med Name: MOUNJARO  2.5MG /0.5ML INJ ( 4 PENS)] 2 mL 1    Sig: ADMINISTER 2.5 MG UNDER THE SKIN 1 TIME A WEEK     Off-Protocol Failed - 10/21/2023  3:20 PM      Failed - Medication not assigned to a protocol, review manually.      Passed - Valid encounter within last 12 months    Recent Outpatient Visits           1 month ago Type 2 diabetes mellitus with hyperglycemia, without long-term current use of insulin (HCC)   [place] [facility] Aletha Bene, MD   4 months ago Benign essential hypertension   [place] [facility] Aletha Bene, MD                 Requested Prescriptions  Pending Prescriptions Disp Refills   MOUNJARO  2.5 MG/0.5ML Pen [Pharmacy Med Name: MOUNJARO  2.5MG /0.5ML INJ ( 4 PENS)] 2 mL 1    Sig: ADMINISTER 2.5 MG UNDER THE SKIN 1 TIME A WEEK     Off-Protocol Failed - 10/21/2023  3:20 PM      Failed - Medication not assigned to a protocol, review manually.      Passed - Valid encounter within last 12 months    Recent Outpatient Visits           1 month ago Type 2 diabetes mellitus with hyperglycemia, without long-term current use of insulin (HCC)   [place] [facility] Aletha Bene, MD   4 months ago Benign essential hypertension   [place] [facility] Aletha Bene, MD

## 2023-10-24 ENCOUNTER — Encounter: Payer: Self-pay | Admitting: Family Medicine

## 2023-10-24 ENCOUNTER — Other Ambulatory Visit: Payer: Self-pay

## 2023-10-24 DIAGNOSIS — E1165 Type 2 diabetes mellitus with hyperglycemia: Secondary | ICD-10-CM

## 2023-10-24 DIAGNOSIS — E785 Hyperlipidemia, unspecified: Secondary | ICD-10-CM

## 2023-10-24 MED ORDER — TIRZEPATIDE 5 MG/0.5ML ~~LOC~~ SOAJ: 5.0000 mg | SUBCUTANEOUS | 1 refills | Status: DC

## 2023-11-16 ENCOUNTER — Encounter: Payer: Self-pay | Admitting: Family Medicine

## 2023-11-17 ENCOUNTER — Other Ambulatory Visit: Payer: Self-pay

## 2023-11-17 DIAGNOSIS — Z23 Encounter for immunization: Secondary | ICD-10-CM

## 2023-11-17 MED ORDER — COVID-19 MRNA VACC (MODERNA) 50 MCG/0.5ML IM SUSY: 0.5000 mL | PREFILLED_SYRINGE | Freq: Once | INTRAMUSCULAR | 0 refills | Status: AC

## 2023-12-16 ENCOUNTER — Encounter: Payer: Self-pay | Admitting: Family Medicine

## 2023-12-19 ENCOUNTER — Other Ambulatory Visit: Payer: Self-pay | Admitting: Family Medicine

## 2023-12-19 DIAGNOSIS — E1165 Type 2 diabetes mellitus with hyperglycemia: Secondary | ICD-10-CM

## 2023-12-19 DIAGNOSIS — E785 Hyperlipidemia, unspecified: Secondary | ICD-10-CM

## 2023-12-29 ENCOUNTER — Other Ambulatory Visit: Payer: Self-pay

## 2023-12-29 DIAGNOSIS — Z9189 Other specified personal risk factors, not elsewhere classified: Secondary | ICD-10-CM

## 2023-12-30 NOTE — Progress Notes (Signed)
 Physical Therapy Visit - Daily Note   Payor: [employer] / Plan: [employer]IG[employer] NET OP ACC / Product Type: POS /   Visit Count: 9   Rehabilitation Precautions/Restrictions:   Precautions/Restrictions Precautions: cervical fusion 2012; MULTI JOINT OA/bursitis (hips/knees/shoulders);    Rt Foot:  05/04/21 CHEILECTOMY 1ST MP JT W/O IMPLT (for hallux rigidus) by Dekarlos Megail Dial , DPM  **Residual toe stiffness/pain; Restrictions: PES PLANUS - pending new vs adjustment of existing custom orthotics.   Left hip/knee pain (throbbing like a toothache) after supine femur arcs   **Advised of h/o lumbar ablation in 2024.        Referring Diagnosis: M72.2 - Plantar fasciitis, left M76.62 - Achilles tendinitis, left leg   SUBJECTIVE Patient Report:    Change in Status Since Last Visit: Yes - Onset of Left hip and thigh pain over a 10 after doing supine marches on Thursday.  Had to take pain medication to get it to calm down.   Pain:    Residual toothache feeling in the Left hip/knee  - 6/10 intensity.   Was off from work yesterday which helped reduce symptoms.    **When asked about history of lumbar spine issues patient says I used to  - and that she had an ablation over a year ago which resolved back pain (has never had symptoms down the leg).     OBJECTIVE  General Observation/Objective Measures:    AROM Lumbar Spine WFL all planes/directions EXCEPT Flexion - patient requires ~ 60d of knee flex to approximate fingertips to floor. With legs straight patient's fingertips approximate midshin (back is FLAT)     PPT relieving of Left leg pain.    Left iliacus in spasm with associated tenderness       Interventions:  Therapeutic Exercise:93minutes Obj measures as noted Standing, sitting and supine PPT (with mini bridge) (relieving of Left lower extremity symptoms) Standing with left foot on low mat + rounding trunk over fw (forward) leg - also relieving  Manual Therapy: 25 minutes Palpation +  left iliacus release and quadriceps STM (soft tissue mobilization) (entire length/surface) with progressive angles of knee flex to tolerance.   Education: modifications to HEP (home exercise program) - eliminate supine marching and also hold standing upper extremity wall slide + reciprocal hip flexion til next session   ASSESSMENT Patient arrived with chief c/o Left hip/posterior thigh and knee pain of a toothache nature (consistent with sciatica)- confirmed history of lumbar spine pathology requiring ablation to resolve (but never had radicular symptoms).  Iliacus in spasm and general lack of lumbar flexion - today's session focused on reducing tone/relieving pain and decompressing lumbar spine - all relieving of left lower extremity symptoms. Patient VU of recommended activity modifications.    Therapy Diagnosis:     ICD-10-CM   1. Difficulty walking  R26.2   2. Weakness  R53.1   3. Left Achilles tendinitis  M76.62   4. Plantar fasciitis of left foot  M72.2   5. Pes planus of both feet  M21.41, M21.42     Progress Towards Goals:   limited by left hip/thigh/knee pain  Goals Addressed             This Visit's Progress   . PT Goals - HPPT location - Left achilles/plantar fasciitis       SHORT TERM GOALS: Target date: 12/05/23 1. Patient will be independent with initial HEP. Baseline: gastroc stretches, 1st ray mobilization Goal status: Initial 09/26: Ball roll under foot, soleus stretches MET  2. Patient will have obtained appropriate footwear/gone for orthotic fitting Baseline: wearing [place] sneakers made for supinatory foot type, insoles that do not achieve subtalar neutral (strong pronation moment bilateral) Goal status: Initial 09/26: MET (obtained immediately- had at 1st f/u)   LONG TERM GOALS: Target date: within 12 visits (by 02/06/24) 1. Patient will be independent with advanced/ongoing HEP to improve outcomes and carryover. Baseline: se STG Goal status:  Initial 10/01: Make mental note of response to band resisted ham curls - if tolerable will provide band for carryover to HEP (home exercise program) in future Ok to do hip abd isometrics into wall and march with wall slide every day as tolerated.  10/15:  Patient has blue band loop for resisted hip abd, hip ext Positional methods for promoting thoracic extension Bridging 10/21: Variations of bridging with blue band loop at distal thighs 3-4d/wk Balance exs daily - single leg firm eyes open,  tandem firm eyes closed - goal 30 seconds.  **May need written guideline in future to aide recall  2. Patient will report at least 75% improvement in Left ankle/foot pain to improve QOL. Baseline: up to 8/10 intensity  Goal status: Initial 10/15: 0/10 foot or achilles pain - mild soreness lateral head of gastroc  3. Patient will demonstrate improved bil ankle AROM into DF  & 1st toe flex/ext to allow for normal gait and stair mechanics. Baseline: 11/17/23 Eval:  DF Left: 5A/10P   Right 3A/15P 1st MTP flex  Left: 20A/40P     Right 25A/30P  1st MTP ext: Left: 45A/62 CKC Right 20A/42 CKC  Goal status: Initial  4. Patient will demonstrate improved functional LE strength as demonstrated by ability to perform 10x sts w/o (without) use of hands tolerably. Baseline: 11/17/23 Eval:  Bilateral calf raise in standing 25 reps moderate - that toe still hurts (Rt 1st ray)  & left achilles pain.   **Did not test unilateral  Feet/ankles test 5/5 throughout otherwise Quads 5 Hams Left 3+, Rt 4- Hip flex 5 bilaterally  Bridge for hip ext testing - immediately shaky - 4 to overpressure   Bilateral bridge sustained 30 sec = moderate difficulty - no pain.  Sidelying  Hip ABD SLR (straight leg raise) -  3 bilaterally Hip Add SLR (straight leg raise) -   3 bilaterally     Goal status: Initial  5. Patient will report 9 points (MCID) or better improvement on LEFS to demonstrate improved functional  ability. Baseline: 36.25% Goal status: Initial  8. Patient will report incr. Standing/walking tolerance approaching PLOF Baseline: up to 8/10 pain with standing for work.  Goal status: Initial 10/15: Able to tolerate long duration standing for work with only mild fatigue in feet (no pain) and mild soreness left lateral gastroc.           PLAN Treatment Frequency and Duration:  PT Frequency: 2x/week; 1x/week PT Duration: 90 days Treatment Plan Details: [employer] OA (INN) (PH [phone]) EFF 03/05/2023 DED 1000 / 1000 MET OOP 4500 / 4500 MET (YES FOR FAM) COINS 100% (OOP MET) ++ COPAY (60) 0 SINCE OOP MET  Recommended PT Treatment/Interventions: Electrical stimulation-attended (02967); Electrical stimulation-unattended (02985); Iontophoresis [phone]); Dry needling (1-2 muscles) (79439); Dry Needling (3+ muscles) (79438); Gait training (02883); Therapeutic exercise (97110); Therapeutic activity (97530); Manual therapy (97140); Self-care/home management [phone]); Neuromuscular re-education [phone]) (heat/cold modalities, FDA approved cold laser, pilates based rehab)   Recommended Consults:  None currently  Development of Plan of Care:  No change in POC.  Total Treatment Time (Time & Untimed): Total Treatment Time: 40 Total Time in Timed Codes: Time in Timed Codes: 40     Treatment/Procedures Manual Therapy Tech, 1 + regions minutes: 25 Therapeutic Exercises minutes: 15             The patient has been instructed to contact our clinic if any questions or problems should arise.

## 2024-01-09 ENCOUNTER — Ambulatory Visit: Admitting: Family Medicine

## 2024-01-09 ENCOUNTER — Encounter: Payer: Self-pay | Admitting: Family Medicine

## 2024-01-09 VITALS — BP 122/80 | HR 93 | Temp 98.2°F | Ht 65.0 in | Wt 199.4 lb

## 2024-01-09 DIAGNOSIS — Z7985 Long-term (current) use of injectable non-insulin antidiabetic drugs: Secondary | ICD-10-CM

## 2024-01-09 DIAGNOSIS — E782 Mixed hyperlipidemia: Secondary | ICD-10-CM | POA: Diagnosis not present

## 2024-01-09 DIAGNOSIS — N951 Menopausal and female climacteric states: Secondary | ICD-10-CM

## 2024-01-09 DIAGNOSIS — I10 Essential (primary) hypertension: Secondary | ICD-10-CM

## 2024-01-09 DIAGNOSIS — E1165 Type 2 diabetes mellitus with hyperglycemia: Secondary | ICD-10-CM | POA: Diagnosis not present

## 2024-01-09 DIAGNOSIS — M25552 Pain in left hip: Secondary | ICD-10-CM

## 2024-01-09 DIAGNOSIS — Z23 Encounter for immunization: Secondary | ICD-10-CM

## 2024-01-09 MED ORDER — VENLAFAXINE HCL ER 75 MG PO CP24: 75.0000 mg | ORAL_CAPSULE | Freq: Every day | ORAL | 1 refills | Status: AC

## 2024-01-09 NOTE — Progress Notes (Signed)
 Patient Office Visit  Assessment & Plan:  Benign essential hypertension -     CBC with Differential/Platelet -     Comprehensive metabolic panel with GFR  Mixed hyperlipidemia -     Lipid panel  Type 2 diabetes mellitus with hyperglycemia, without long-term current use of insulin (HCC) -     Hemoglobin A1c  Need for pneumococcal 20-valent conjugate vaccination -     Pneumococcal conjugate vaccine 20-valent  Hot flashes due to menopause -     Venlafaxine  HCl ER; Take 1 capsule (75 mg total) by mouth daily with breakfast.  Dispense: 90 capsule; Refill: 1  Pain of left hip   Assessment and Plan    Essential hypertension Blood pressure well-controlled with amlodipine  and Coreg . Weight loss and dietary changes improved control. - Continue current antihypertensive regimen.  Type 2 diabetes mellitus Effective management with Mounjaro , contributing to weight loss and improved blood sugar control. - Continue Mounjaro  for diabetes management.  Mixed hyperlipidemia Managed with Crestor . No recent lipid panel results discussed. - Continue Crestor  for lipid management.  Obesity Weight loss achieved through lifestyle changes and Mounjaro . Continued management important for health improvement. - Continue current weight management strategies.  Menopausal symptoms (hot flashes) Persistent hot flashes despite Effexor . Symptoms affect daily activities. - Increased Effexor  to 75 mg.  Chronic left hip pain and osteoarthritis Chronic pain persists despite physical therapy and previous prednisone . Weight loss may help symptoms. - Continue physical therapy. - Consider Tylenol for pain management. - Discussed potential benefits of vitamin D supplementation.  Chronic insomnia and suspected sleep apnea Insomnia persists with four hours of sleep per night. Suspected sleep apnea due to dental findings and fatigue. - Continue with CPAP therapy for suspected sleep apnea.  General Health  Maintenance Up to date on flu shot and mammogram. Colonoscopy due. Pneumonia vaccination recommended for over 50. - Administered pneumonia vaccination. - Schedule colonoscopy.     Recommend healthy diet i.e mediterranean/DASH diet, consistent exercise - 30 minutes 5 day per week, and gradual weight loss. Congratulated her on weight loss.  Return in about 3 months (around 04/10/2024), or if symptoms worsen or fail to improve.   Subjective:    Patient ID: Ana Ruiz, female    DOB: 16-May-1965  Age: 58 y.o. MRN: [id number]  Chief Complaint  Patient presents with   Medical Management of Chronic Issues    HPI Discussed the use of AI scribe software for clinical note transcription with the patient, who gave verbal consent to proceed.  History of Present Illness       History of Present Illness Ana Ruiz is a 58 year old female with hypertension and arthritis who presents with concerns about sleep disturbances and arthritis pain and chronic medical issues.   She experiences ongoing sleep disturbances, averaging about four hours of sleep per night and waking up tired. Despite taking medications such as doxepin  and Zanaflex , her sleep has not improved. She mentions a history of being told she might have sleep apnea, which was investigated in December.  She has arthritis with pain primarily in her left hip, knee, and shoulder. The pain is persistent throughout the day and night, affecting her ability to sleep. She has tried various treatments including physical therapy, methocarbamol , Zanaflex , and prednisone , but reports limited relief. She performs 30 minutes of physical therapy exercises every morning.  Her current medications include amlodipine , Coreg , Crestor , and Mounjaro . She reports a decrease in appetite and weight loss, which she attributes to  Mounjaro  and lifestyle changes. She also takes Effexor  for mood and hot flashes, though she feels it has not helped with the hot  flashes.  She experiences frequent hot flashes, requiring her to use a fan at work. Despite taking Effexor , these symptoms persist.  Her blood pressure is reportedly better than previous visits, and she has been making healthier dietary choices.  She has a history of Achilles tendinitis and has completed physical therapy for her shoulder. She continues to attend physical therapy for her Achilles tendinitis, which has now affected her knees and hip.  She drinks about four to five large cups of water per day, noting that one of her medications makes her feel thirsty.  She has received her flu shot in September and is up to date with her mammogram and eye exam. She has a history of a chipped tooth and was advised by her dentist about the possibility of sleep apnea.  Assessment and Plan Essential hypertension Blood pressure well-controlled with amlodipine  and Coreg . Weight loss and dietary changes improved control. - Continue current antihypertensive regimen.  Type 2 diabetes mellitus Effective management with Mounjaro , contributing to weight loss and improved blood sugar control. - Continue Mounjaro  for diabetes management.  Mixed hyperlipidemia Managed with Crestor . No recent lipid panel results discussed. - Continue Crestor  for lipid management.  Obesity Weight loss achieved through lifestyle changes and Mounjaro . Continued management important for health improvement. - Continue current weight management strategies.  Menopausal symptoms (hot flashes) Persistent hot flashes despite Effexor . Symptoms affect daily activities. - Increased Effexor  to 75 mg.  Chronic left hip pain and osteoarthritis Chronic pain persists despite physical therapy and previous prednisone . Weight loss may help symptoms. - Continue physical therapy. - Consider Tylenol for pain management. - Discussed potential benefits of vitamin D supplementation.  Chronic insomnia and suspected sleep apnea Insomnia  persists with four hours of sleep per night. Suspected sleep apnea due to dental findings and fatigue. - Continue with CPAP therapy for suspected sleep apnea.  General Health Maintenance Up to date on flu shot and mammogram. Colonoscopy due. Pneumonia vaccination recommended for over 50. - Administered pneumonia vaccination. - Schedule colonoscopy.    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     High cholesterol    Hyperlipidemia 08/09/2014   Hypertension    Insomnia 02/07/2017   Intramural and submucous leiomyoma of uterus 04/17/2023   Neck pain, bilateral 12/26/2015   Need for shingles vaccine 06/05/2023   Obesity (BMI 30-39.9) 05/16/2014   Obesity due to excess calories with serious comorbidity 05/16/2014   Other constipation 05/11/2015   Persistent headaches 12/21/2013   Last Assessment & Plan:   Start Elavil  25mg  PO QHS. RTC for CPE in the next few weeks.     Plantar fasciitis, bilateral 02/06/2015   Postmenopausal bleeding 04/17/2023   Preventative health care 01/09/2016   S/P  cervical spinal fusion 04/15/2023   S/P foot surgery, right 05/09/2021   Strain of thoracic spine 12/26/2015   Type 2 diabetes mellitus with hyperglycemia, without long-term current use of insulin (HCC) 07/27/2019   Past Surgical History:  Procedure Laterality Date   BACK SURGERY      ANTERIOR CERVICAL DISCECTOMY W/ FUSION   FOOT SURGERY Right 05/04/2021   Dr Joaquim- CHEILECTOMY   HYSTEROSCOPY WITH D & C  08/13/2022   Dr Kathy   TUBAL LIGATION     Social History   Tobacco Use   Smoking status: Never   Smokeless tobacco: Never  Vaping Use   Vaping status: Never Used  Substance Use Topics   Alcohol use: Never   Drug use: Never   Family History  Problem Relation Age of Onset   Colon cancer Mother    Ovarian cancer Mother    Heart disease Mother    Pulmonary embolism Mother    Hypertension Mother    Diabetes Mellitus II Maternal Aunt    Hypertension Maternal Uncle    Heart disease Maternal Uncle    Heart attack Maternal Uncle    Heart attack Maternal Uncle    Heart attack Maternal Uncle    Leukemia Cousin    Lymphoma Cousin    Allergies  Allergen Reactions   Meloxicam Nausea And Vomiting   Naproxen Nausea Only    CP    ROS    Objective:    BP 122/80   Pulse 93   Temp 98.2 F (36.8 C)   Ht 5' 5 (1.651 m)   Wt 199 lb 6 oz (90.4 kg)   LMP 11/12/2010   SpO2 98%   BMI 33.18 kg/m  BP Readings from Last 3 Encounters:  01/09/24 122/80  09/22/23 138/80  09/08/23 122/78   Wt Readings from Last 3 Encounters:  01/09/24 199 lb 6 oz (90.4 kg)  09/22/23 209 lb (94.8 kg)  09/08/23 212 lb 4 oz (96.3 kg)    Physical Exam Vitals and nursing note reviewed.  Constitutional:      Appearance: Normal appearance.  HENT:     Head: Normocephalic.     Right Ear: Tympanic membrane, ear canal and external ear normal.     Left Ear: Tympanic membrane, ear canal and external ear normal.  Eyes:     Extraocular Movements: Extraocular movements intact.     Conjunctiva/sclera: Conjunctivae normal.     Pupils: Pupils are equal, round, and reactive to light.  Cardiovascular:     Rate and Rhythm: Normal rate and regular rhythm.     Heart sounds: Normal heart sounds.  Pulmonary:     Effort: Pulmonary effort is normal.     Breath sounds: Normal breath  sounds.  Musculoskeletal:     Right lower leg: No edema.     Left lower leg: No edema.  Neurological:     General: No focal deficit present.     Mental Status: She is alert and oriented to person, place, and time.  Psychiatric:        Mood and Affect: Mood normal.        Behavior: Behavior normal.        Thought Content: Thought content normal.        Judgment: Judgment normal.      No results found for any visits on 01/09/24.

## 2024-01-10 LAB — CBC WITH DIFFERENTIAL/PLATELET
Absolute Lymphocytes: 1600 {cells}/uL (ref 850–3900)
Absolute Monocytes: 333 {cells}/uL (ref 200–950)
Basophils Absolute: 21 {cells}/uL (ref 0–200)
Basophils Relative: 0.6 %
Eosinophils Absolute: 21 {cells}/uL (ref 15–500)
Eosinophils Relative: 0.6 %
HCT: 39.4 % (ref 35.0–45.0)
Hemoglobin: 12.6 g/dL (ref 11.7–15.5)
MCH: 27 pg (ref 27.0–33.0)
MCHC: 32 g/dL (ref 32.0–36.0)
MCV: 84.4 fL (ref 80.0–100.0)
MPV: 11.3 fL (ref 7.5–12.5)
Monocytes Relative: 9.5 %
Neutro Abs: 1526 {cells}/uL (ref 1500–7800)
Neutrophils Relative %: 43.6 %
Platelets: 226 Thousand/uL (ref 140–400)
RBC: 4.67 Million/uL (ref 3.80–5.10)
RDW: 13.9 % (ref 11.0–15.0)
Total Lymphocyte: 45.7 %
WBC: 3.5 Thousand/uL — ABNORMAL LOW (ref 3.8–10.8)

## 2024-01-10 LAB — HEMOGLOBIN A1C
Hgb A1c MFr Bld: 5.7 % — ABNORMAL HIGH (ref <5.7)
Mean Plasma Glucose: 117 mg/dL
eAG (mmol/L): 6.5 mmol/L

## 2024-01-10 LAB — COMPREHENSIVE METABOLIC PANEL WITH GFR
AG Ratio: 1.7 (calc) (ref 1.0–2.5)
ALT: 15 U/L (ref 6–29)
AST: 17 U/L (ref 10–35)
Albumin: 4.3 g/dL (ref 3.6–5.1)
Alkaline phosphatase (APISO): 56 U/L (ref 37–153)
BUN: 8 mg/dL (ref 7–25)
CO2: 29 mmol/L (ref 20–32)
Calcium: 9.3 mg/dL (ref 8.6–10.4)
Chloride: 103 mmol/L (ref 98–110)
Creat: 0.73 mg/dL (ref 0.50–1.03)
Globulin: 2.6 g/dL (ref 1.9–3.7)
Glucose, Bld: 85 mg/dL (ref 65–99)
Potassium: 3.5 mmol/L (ref 3.5–5.3)
Sodium: 141 mmol/L (ref 135–146)
Total Bilirubin: 0.4 mg/dL (ref 0.2–1.2)
Total Protein: 6.9 g/dL (ref 6.1–8.1)
eGFR: 95 mL/min/1.73m2 (ref >=60)

## 2024-01-10 LAB — LIPID PANEL
Cholesterol: 199 mg/dL (ref <200)
HDL: 96 mg/dL (ref >=50)
LDL Cholesterol (Calc): 89 mg/dL
Non-HDL Cholesterol (Calc): 103 mg/dL (ref <130)
Total CHOL/HDL Ratio: 2.1 (calc) (ref <5.0)
Triglycerides: 57 mg/dL (ref <150)

## 2024-01-12 ENCOUNTER — Ambulatory Visit: Payer: Self-pay | Admitting: Family Medicine

## 2024-02-02 ENCOUNTER — Ambulatory Visit: Admitting: Primary Care

## 2024-02-02 ENCOUNTER — Encounter: Payer: Self-pay | Admitting: Primary Care

## 2024-02-02 VITALS — BP 128/70 | HR 99 | Temp 97.3°F | Ht 65.0 in | Wt 202.6 lb

## 2024-02-02 DIAGNOSIS — R0683 Snoring: Secondary | ICD-10-CM | POA: Diagnosis not present

## 2024-02-02 DIAGNOSIS — G4700 Insomnia, unspecified: Secondary | ICD-10-CM

## 2024-02-02 DIAGNOSIS — F5101 Primary insomnia: Secondary | ICD-10-CM

## 2024-02-02 NOTE — Progress Notes (Signed)
 @Patient  ID: Ana Ruiz, female    DOB: Nov 11, 1965, 58 y.o.   MRN: [id number]  No chief complaint on file.   Referring provider: Aletha Bene, MD  HPI: 58 year old, never smoked. PMH significant for HTN, GERD, type 2 diabetes, plantar fasciitis.   02/02/2024 Discussed the use of AI scribe software for clinical note transcription with the patient, who gave verbal consent to proceed. History of Present Illness Ana Ruiz is a 58 year old female who presents with sleep disturbances for a sleep consult. She was referred by her dentist due to concerns of possible sleep apnea related to teeth grinding.  She experiences difficulty initiating and maintaining sleep, going to bed between 10 and 11 PM but taking up to two hours to fall asleep. She wakes up several times during the night, averaging three to four awakenings, and starts her day between 7 and 9 AM, estimating about six to seven hours of sleep per night.  She has not undergone a sleep study before. Her husband reports that she snores, although she sometimes doubts this. She occasionally wakes herself up gasping. She experiences daytime sleepiness but tries to avoid napping during the day, which she notes does not help her nighttime sleep.  In terms of daytime sleepiness, she reports a high likelihood of dozing off as a passenger in a car for an hour without a break and a moderate likelihood of falling asleep when lying down in the afternoon if circumstances permit. No likelihood of dozing off while sitting and reading, watching TV, sitting inactive in a public place, sitting and talking to someone, sitting quietly after lunch without alcohol, or in a car stopped for a few minutes.  She has a history of high blood pressure but no other cardiac history, respiratory issues, or seizures. She has been taking doxepin  for sleep, previously tried amitriptyline , but neither medication has been effective in improving her sleep.  Her  mother had sleep apnea and used a CPAP machine.    Allergies  Allergen Reactions   Meloxicam Nausea And Vomiting   Naproxen Nausea Only    CP    Immunization History  Administered Date(s) Administered   Fluzone Influenza virus vaccine,trivalent (IIV3), split virus 02/28/2009   Influenza, Seasonal, Injecte, Preservative Fre 10/31/2010   Influenza,inj,Quad PF,6+ Mos 12/20/2020, 10/30/2021   Influenza-Unspecified 12/14/2013, 12/21/2014, 12/18/2015, 12/11/2016, 12/20/2017, 12/04/2018, 11/26/2022, 11/24/2023   Moderna Covid-19 Fall Seasonal Vaccine 48yrs & older 12/05/2021   Moderna Sars-Covid-2 Vaccination 05/21/2019, 06/21/2019, 03/16/2020   PNEUMOCOCCAL CONJUGATE-20 01/09/2024   Tdap 05/13/2014   Unspecified SARS-COV-2 Vaccination 11/26/2022   Zoster Recombinant(Shingrix ) 06/05/2023, 09/08/2023    Past Medical History:  Diagnosis Date   Acquired hallux rigidus of right foot 03/28/2021   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     High cholesterol    Hyperlipidemia 08/09/2014   Hypertension    Insomnia 02/07/2017   Intramural and submucous leiomyoma of uterus 04/17/2023   Neck pain, bilateral 12/26/2015   Need for shingles vaccine 06/05/2023   Obesity (  BMI 30-39.9) 05/16/2014   Obesity due to excess calories with serious comorbidity 05/16/2014   Other constipation 05/11/2015   Persistent headaches 12/21/2013   Last Assessment & Plan:   Start Elavil  25mg  PO QHS. RTC for CPE in the next few weeks.     Plantar  fasciitis, bilateral 02/06/2015   Postmenopausal bleeding 04/17/2023   Preventative health care 01/09/2016   S/P cervical spinal fusion 04/15/2023   S/P foot surgery, right 05/09/2021   Strain of thoracic spine 12/26/2015   Type 2 diabetes mellitus with hyperglycemia, without long-term current use of insulin (HCC) 07/27/2019    Tobacco History: Social History   Tobacco Use  Smoking Status Never  Smokeless Tobacco Never   Counseling given: Not Answered   Outpatient Medications Prior to Visit  Medication Sig Dispense Refill   amLODipine  (NORVASC ) 5 MG tablet Take 1 tablet (5 mg total) by mouth daily. 90 tablet 1   Blood Glucose Monitoring Suppl (GLUCOCOM BLOOD GLUCOSE MONITOR) DEVI 1 each by Other route See admin instructions.     carvedilol  (COREG ) 12.5 MG tablet Take 1 tablet (12.5 mg total) by mouth 2 (two) times daily. 180 tablet 3   doxepin  (SINEQUAN ) 10 MG capsule Take 1 capsule (10 mg total) by mouth at bedtime as needed. 90 capsule 1   glucose blood (PRECISION QID TEST) test strip 1 each by Other route as directed.     linaclotide  (LINZESS ) 290 MCG CAPS capsule Take 1 capsule (290 mcg total) by mouth daily before breakfast. 90 capsule 1   methocarbamol  (ROBAXIN ) 500 MG tablet Take 1 tablet (500 mg total) by mouth every 8 (eight) hours as needed for muscle spasms. 90 tablet 1   MOUNJARO  5 MG/0.5ML Pen ADMINISTER 5 MG UNDER THE SKIN 1 TIME A WEEK 2 mL 1   rosuvastatin  (CRESTOR ) 10 MG tablet Take 1 tablet (10 mg total) by mouth daily. 90 tablet 3   tiZANidine  (ZANAFLEX ) 4 MG tablet Take 1 tablet (4 mg total) by mouth at bedtime. 90 tablet 1   venlafaxine  XR (EFFEXOR  XR) 75 MG 24 hr capsule Take 1 capsule (75 mg total) by mouth daily with breakfast. 90 capsule 1   No facility-administered medications prior to visit.   Review of Systems  Review of Systems  Constitutional:  Positive for fatigue.  Respiratory: Negative.    Psychiatric/Behavioral:  Positive for sleep  disturbance.    Physical Exam  LMP 11/12/2010  Physical Exam Constitutional:      Appearance: Normal appearance. She is well-developed.  HENT:     Head: Normocephalic and atraumatic.     Mouth/Throat:     Mouth: Mucous membranes are moist.     Pharynx: Oropharynx is clear.  Eyes:     Pupils: Pupils are equal, round, and reactive to light.  Cardiovascular:     Rate and Rhythm: Normal rate and regular rhythm.     Heart sounds: Normal heart sounds. No murmur heard. Pulmonary:     Effort: Pulmonary effort is normal. No respiratory distress.     Breath sounds: Normal breath sounds. No wheezing or rhonchi.  Musculoskeletal:        General: Normal range of motion.     Cervical back: Normal range of motion and neck supple.  Skin:    General: Skin is warm and dry.     Findings: No erythema or rash.  Neurological:     General: No focal deficit present.     Mental Status: She is alert and oriented  to person, place, and time. Mental status is at baseline.  Psychiatric:        Mood and Affect: Mood normal.        Behavior: Behavior normal.        Thought Content: Thought content normal.        Judgment: Judgment normal.     Lab Results:  CBC    Component Value Date/Time   WBC 3.5 (L) 01/09/2024 1209   RBC 4.67 01/09/2024 1209   HGB 12.6 01/09/2024 1209   HCT 39.4 01/09/2024 1209   PLT 226 01/09/2024 1209   MCV 84.4 01/09/2024 1209   MCH 27.0 01/09/2024 1209   MCHC 32.0 01/09/2024 1209   RDW 13.9 01/09/2024 1209   EOSABS 21 01/09/2024 1209   BASOSABS 21 01/09/2024 1209    BMET    Component Value Date/Time   NA 141 01/09/2024 1209   K 3.5 01/09/2024 1209   CL 103 01/09/2024 1209   CO2 29 01/09/2024 1209   GLUCOSE 85 01/09/2024 1209   BUN 8 01/09/2024 1209   CREATININE 0.73 01/09/2024 1209   CALCIUM  9.3 01/09/2024 1209   GFRNONAA >60 08/03/2010 1240   GFRAA  08/03/2010 1240    >60        The eGFR has been calculated using the MDRD equation. This calculation has  not been validated in all clinical situations. eGFR's persistently <60 mL/min signify possible Chronic Kidney Disease.    BNP No results found for: BNP  ProBNP No results found for: PROBNP  Imaging: No results found.   Assessment & Plan:   1. Loud snoring (Primary) - Home sleep test; Future  2. Primary insomnia  Assessment and Plan Assessment & Plan Insomnia Chronic insomnia with difficulty initiating and maintaining sleep, averaging 6-7 hours per night. Previous trials of amitriptyline  and doxepin  were ineffective. Low daytime sleepiness score (5/24) suggests low risk for narcolepsy. Sleep hygiene practices discussed, including maintaining a regular sleep schedule, and reducing screen time before bed. Potential benefits of addressing underlying sleep apnea on insomnia symptoms discussed. - Continue doxepin  for sleep. - Maintain regular sleep schedule and good sleep hygiene. - Will evaluate for sleep apnea with home sleep study. - Will consider alternative medication classes if insomnia persists after sleep apnea evaluation.  Suspected obstructive sleep apnea with associated bruxism Suspected obstructive sleep apnea due to symptoms of snoring, restless sleep, and waking up gasping. Bruxism noted, which can be associated with sleep apnea. Family history of sleep apnea. No prior sleep study conducted. Discussed potential risks of untreated sleep apnea, including cardiac arrhythmias, stroke, diabetes, and pulmonary hypertension. Explained severity classifications and treatment options, including CPAP and oral appliances. Discussed potential benefits of oral appliances for mild to moderate cases and CPAP for moderate to severe cases. Explained risk of untreated sleep apnea.  - Ordered home sleep study through [facility]. - Will discuss potential treatment options post-sleep study, including CPAP and oral appliances. - Will consider referral to orthodontic dentist certified  in sleep apnea treatment if oral appliance is chosen.  Almarie LELON Ferrari, NP 02/02/2024

## 2024-02-02 NOTE — Patient Instructions (Signed)
  VISIT SUMMARY: Today, you were seen for sleep disturbances and possible sleep apnea. You have been experiencing difficulty falling and staying asleep, waking up multiple times during the night, and feeling daytime sleepiness. Your dentist referred you due to concerns about sleep apnea related to teeth grinding. We discussed your sleep patterns, previous medications, and family history of sleep apnea.  YOUR PLAN: -INSOMNIA: Insomnia is a condition where you have trouble falling or staying asleep. We discussed maintaining a regular sleep schedule, avoiding activities in bed, and reducing screen time before bed. You should continue taking doxepin  for sleep. We will evaluate for sleep apnea with a home sleep study and consider other medications if needed.  -SUSPECTED OBSTRUCTIVE SLEEP APNEA WITH ASSOCIATED BRUXISM: Obstructive sleep apnea is a condition where your breathing stops and starts during sleep, often causing snoring and restless sleep. Bruxism, or teeth grinding, can be associated with sleep apnea. We ordered a home sleep study to evaluate this further. Depending on the results, we may discuss treatment options like CPAP or oral appliances. If an oral appliance is chosen, we may refer you to an orthodontic dentist certified in sleep apnea treatment.  INSTRUCTIONS: Please complete the home sleep study as ordered. We will discuss the results and potential treatment options at your follow-up appointment.  Follow-up Call or schedule follow-up after tour sleep study, please allow 2-3 weeks to get results back after completing

## 2024-02-18 ENCOUNTER — Other Ambulatory Visit: Payer: Self-pay | Admitting: Family Medicine

## 2024-02-18 DIAGNOSIS — E785 Hyperlipidemia, unspecified: Secondary | ICD-10-CM

## 2024-02-18 DIAGNOSIS — E1165 Type 2 diabetes mellitus with hyperglycemia: Secondary | ICD-10-CM

## 2024-02-24 ENCOUNTER — Encounter

## 2024-02-24 DIAGNOSIS — R0683 Snoring: Secondary | ICD-10-CM

## 2024-03-19 ENCOUNTER — Ambulatory Visit

## 2024-03-23 ENCOUNTER — Ambulatory Visit: Admitting: Pulmonary Disease

## 2024-03-23 ENCOUNTER — Encounter: Payer: Self-pay | Admitting: Pulmonary Disease

## 2024-03-23 VITALS — BP 155/90 | HR 102 | Ht 65.0 in | Wt 196.5 lb

## 2024-03-23 DIAGNOSIS — G4710 Hypersomnia, unspecified: Secondary | ICD-10-CM | POA: Diagnosis not present

## 2024-03-23 DIAGNOSIS — R0683 Snoring: Secondary | ICD-10-CM

## 2024-03-23 DIAGNOSIS — G4700 Insomnia, unspecified: Secondary | ICD-10-CM

## 2024-03-23 MED ORDER — TRAZODONE HCL 50 MG PO TABS: 50.0000 mg | ORAL_TABLET | Freq: Every day | ORAL | 3 refills | Status: AC

## 2024-03-23 NOTE — Progress Notes (Signed)
 "              Ana Ruiz    [id number]    [birth year]  Primary Care Physician:Aguiar, Connie, MD  Referring Physician: Aletha Connie, MD [address] [place],  [place] [address]  Chief complaint:   Patient being followed up for obstructive sleep apnea Insomnia  HPI:  Has had chronic insomnia, multiple medications tried Recently on Silenor  Still not sleeping well with this  Recently had a sleep study showing AHI of 14.5 with O2 nadir of 83%  She just feels that she is not resting well at night and tired during the day She remains sleepy during the day  Insomnia is chronic has tried doxepin , amitriptyline , were not effective  Difficulty initiating and maintaining sleep Usually goes to bed between 10 and 11 Final wake up time between 7 and 9 AM  She does have daytime sleepiness, tries to avoid napping She is dozes off pretty easily when she is not active  History of hypertension,   Outpatient Encounter Medications as of 03/23/2024  Medication Sig   amLODipine  (NORVASC ) 5 MG tablet Take 1 tablet (5 mg total) by mouth daily.   Blood Glucose Monitoring Suppl (GLUCOCOM BLOOD GLUCOSE MONITOR) DEVI 1 each by Other route See admin instructions.   carvedilol  (COREG ) 12.5 MG tablet Take 1 tablet (12.5 mg total) by mouth 2 (two) times daily.   doxepin  (SINEQUAN ) 10 MG capsule Take 1 capsule (10 mg total) by mouth at bedtime as needed.   glucose blood (PRECISION QID TEST) test strip 1 each by Other route as directed.   linaclotide  (LINZESS ) 290 MCG CAPS capsule Take 1 capsule (290 mcg total) by mouth daily before breakfast.   methocarbamol  (ROBAXIN ) 500 MG tablet Take 1 tablet (500 mg total) by mouth every 8 (eight) hours as needed for muscle spasms.   MOUNJARO  5 MG/0.5ML Pen ADMINISTER 5 MG UNDER THE SKIN 1 TIME A WEEK   rosuvastatin  (CRESTOR ) 10 MG tablet Take 1 tablet (10 mg total) by mouth daily.   tiZANidine  (ZANAFLEX ) 4 MG tablet Take 1 tablet (4 mg total) by mouth at  bedtime.   traZODone  (DESYREL ) 50 MG tablet Take 1 tablet (50 mg total) by mouth at bedtime.   venlafaxine  XR (EFFEXOR  XR) 75 MG 24 hr capsule Take 1 capsule (75 mg total) by mouth daily with breakfast.   No facility-administered encounter medications on file as of 03/23/2024.    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     Hyperlipidemia 08/09/2014   Insomnia 02/07/2017   Intramural and submucous leiomyoma of uterus 04/17/2023   Neck pain, bilateral 12/26/2015   Need for shingles vaccine 06/05/2023   Obesity (BMI 30-39.9) 05/16/2014   Obesity due to excess calories with  serious comorbidity 05/16/2014   Other constipation 05/11/2015   Persistent headaches 12/21/2013   Last Assessment & Plan:   Start Elavil  25mg  PO QHS. RTC for CPE in the next few weeks.     Plantar fasciitis, bilateral 02/06/2015   Postmenopausal bleeding 04/17/2023   Preventative health care 01/09/2016   S/P cervical spinal fusion 04/15/2023   S/P foot surgery, right 05/09/2021   Sinus tachycardia 07/22/2023   Strain of thoracic spine 12/26/2015   Type 2 diabetes mellitus with hyperglycemia, without long-term current use of insulin (HCC) 07/27/2019    Past Surgical History:  Procedure Laterality  Date   BACK SURGERY     ANTERIOR CERVICAL DISCECTOMY W/ FUSION   FOOT SURGERY Right 05/04/2021   Dr Joaquim- CHEILECTOMY   HYSTEROSCOPY WITH D & C  08/13/2022   Dr Kathy   TUBAL LIGATION      Family History  Problem Relation Age of Onset   Colon cancer Mother    Ovarian cancer Mother    Heart disease Mother    Pulmonary embolism Mother    Hypertension Mother    Diabetes Mellitus II Maternal Aunt    Hypertension Maternal Uncle    Heart disease Maternal Uncle    Heart attack Maternal Uncle    Heart attack Maternal Uncle    Heart attack Maternal Uncle    Leukemia Cousin    Lymphoma Cousin     Social History   Socioeconomic History   Marital status: Married    Spouse name: Not on file   Number of children: Not on file   Years of education: Not on file   Highest education level: Associate degree: occupational, scientist, product/process development, or vocational program  Occupational History   Not on file  Tobacco Use   Smoking status: Never   Smokeless tobacco: Never  Vaping Use   Vaping status: Never Used  Substance and Sexual Activity   Alcohol use: Never   Drug use: Never   Sexual activity: Not on file  Other Topics Concern   Not on file  Social History Narrative   Not on file   Social Drivers of Health   Tobacco Use: Low Risk (03/23/2024)   Patient History    Smoking Tobacco Use: Never    Smokeless Tobacco Use: Never    Passive Exposure: Not on file  Financial Resource Strain: Low Risk (01/02/2024)   Overall Financial Resource Strain (CARDIA)    Difficulty of Paying Living Expenses: Not very hard  Food Insecurity: No Food Insecurity (01/02/2024)   Epic    Worried About Programme Researcher, Broadcasting/film/video in the Last Year: Never true    [employer] of Food in the Last Year: Never true  Transportation Needs: No Transportation Needs (01/02/2024)   Epic    Lack of Transportation (Medical): No    Lack of Transportation (Non-Medical): No  Physical Activity: Insufficiently Active (01/02/2024)   Exercise  Vital Sign    Days of Exercise per Week: 3 days    Minutes of Exercise per Session: 30 min  Stress: Stress Concern Present (01/02/2024)   [employer] of Occupational Health - Occupational Stress Questionnaire    Feeling of Stress: Very much  Social Connections: Socially Integrated (01/02/2024)   Social Connection and Isolation Panel    Frequency of Communication with Friends and Family: More than three times a week    Frequency of Social Gatherings with Friends and Family: Three times a week    Attends Religious Services: More than 4 times  per year    Active Member of Clubs or Organizations: Yes    Attends Banker Meetings: More than 4 times per year    Marital Status: Married  Intimate Partner Violence: Not At Risk (10/29/2021)   Received from [facility] visits prior to 05/04/2022.   Epic    Within the last year, have you been afraid of your partner or ex-partner?: No    Within the last year, have you been humiliated or emotionally abused in other ways by your partner or ex-partner?: No    Within the last year, have you been kicked, hit, slapped, or otherwise physically hurt by your partner or ex-partner?: No    Within the last year, have you been raped or forced to have any kind of sexual activity by your partner or ex-partner?: No  Depression (PHQ2-9): Medium Risk (09/08/2023)   Depression (PHQ2-9)    PHQ-2 Score: 6  Alcohol Screen: Low Risk (01/02/2024)   Alcohol Screen    Last Alcohol Screening Score (AUDIT): 2  Housing: Low Risk (01/02/2024)   Epic    Unable to Pay for Housing in the Last Year: No    Number of Times Moved in the Last Year: 0    Homeless in the Last Year: No  Utilities: Low Risk (05/12/2022)   Received from [facility]   Utilities    In the past 12 months has the electric, gas, oil, or water company threatened to shut off services in your home? : No  Health Literacy: Not on file    Review of Systems  Respiratory:   Positive for apnea.   Psychiatric/Behavioral:  Positive for sleep disturbance.     Vitals:   03/23/24 1103  BP: (!) 155/90  Pulse: (!) 102  SpO2: 97%     Physical Exam Constitutional:      Appearance: She is obese.  HENT:     Head: Normocephalic.     Nose: Nose normal.     Mouth/Throat:     Mouth: Mucous membranes are moist.  Eyes:     General: No scleral icterus. Cardiovascular:     Rate and Rhythm: Normal rate and regular rhythm.     Heart sounds: No murmur heard.    No friction rub.  Pulmonary:     Effort: No respiratory distress.     Breath sounds: No stridor. No wheezing or rhonchi.  Musculoskeletal:        General: No swelling.     Cervical back: No rigidity or tenderness.  Skin:    General: Skin is warm.     Coloration: Skin is not jaundiced.  Neurological:     General: No focal deficit present.     Mental Status: She is alert.  Psychiatric:        Mood and Affect: Mood normal.    Data Reviewed: Recent sleep study reviewed and discussed with the patient   Assessment/Plan: DME referral for new CPAP set up  Cognitive behavioral therapy for insomnia was discussed with the patient Written material provided  Pathophysiology of sleep disordered breathing, treatment options of sleep disordered breathing discussed with the patient Risk with not treating sleep disordered breathing discussed with the patient  Prescription for trazodone  sent in place of Sinequan   Encouraged to give us  a call with any significant concerns  Follow-up in about 3 months to follow-up on CPAP tolerance  I personally spent a total of 30 minutes in the care of the patient today  including preparing to see the patient, getting/reviewing separately obtained history, performing a medically appropriate exam/evaluation, counseling and educating, placing orders, referring and communicating with other health care professionals, documenting clinical information in the EHR, and independently  interpreting results.     Jennet Epley MD [facility] 03/23/2024, 11:24 AM  CC: Aletha Bene, MD   "

## 2024-03-23 NOTE — Patient Instructions (Signed)
" °  We will place a referral to medical supply company DME referral for new CPAP set up  Auto CPAP 5-15 with heated humidification with patient's mask of choice  I placed a prescription for trazodone  to help you with her sleep, do not combine this with what you are already using  Make sure you do more research on the cognitive behavioral therapy to see if this will help you, this is behavioral changes that can help you sleep better, not taking any risk with medications  Follow-up in about 8 to 12 weeks  Call us  with significant concerns  "

## 2024-03-25 ENCOUNTER — Ambulatory Visit

## 2024-03-25 VITALS — BP 138/88 | HR 100 | Ht 65.0 in | Wt 193.8 lb

## 2024-03-25 DIAGNOSIS — E782 Mixed hyperlipidemia: Secondary | ICD-10-CM | POA: Diagnosis not present

## 2024-03-25 DIAGNOSIS — I10 Essential (primary) hypertension: Secondary | ICD-10-CM

## 2024-03-25 MED ORDER — AMLODIPINE BESYLATE 5 MG PO TABS: 7.5000 mg | ORAL_TABLET | Freq: Every day | ORAL | 3 refills | Status: AC

## 2024-03-25 NOTE — Assessment & Plan Note (Signed)
 Suboptimal. Target below 130/80 mmHg. Has an upper arm cuff to monitor at home and readings have been systolic 130s to 859d recently.  Titrate up amlodipine  to 7.5 mg once a day. Continue carvedilol  12.5 mg once daily. Advised to keep a log of blood pressures at home and notify us  if the blood pressure still remains suboptimal.

## 2024-03-25 NOTE — Assessment & Plan Note (Signed)
 Lipid panel from 01/09/2024 total cholesterol 199, HDL 96, triglycerides 57 and LDL 89.  Improved levels. Continue rosuvastatin  10 mg once daily. Tolerating the medication well. Continue with dietary modifications.

## 2024-03-25 NOTE — Patient Instructions (Signed)
 Medication Instructions:  Your physician has recommended you make the following change in your medication:   START: Amlodipine  7.5 mg daily  *If you need a refill on your cardiac medications before your next appointment, please call your pharmacy*  Lab Work: None If you have labs (blood work) drawn today and your tests are completely normal, you will receive your results only by: MyChart Message (if you have MyChart) OR A paper copy in the mail If you have any lab test that is abnormal or we need to change your treatment, we will call you to review the results.  Testing/Procedures: None  Follow-Up: At [facility], you and your health needs are our priority.  As part of our continuing mission to provide you with exceptional heart care, our providers are all part of one team.  This team includes your primary Cardiologist (physician) and Advanced Practice Providers or APPs (Physician Assistants and Nurse Practitioners) who all work together to provide you with the care you need, when you need it.  Your next appointment:   1 year(s)  Provider:   Alean Kobus, MD    We recommend signing up for the patient portal called MyChart.  Sign up information is provided on this After Visit Summary.  MyChart is used to connect with patients for Virtual Visits (Telemedicine).  Patients are able to view lab/test results, encounter notes, upcoming appointments, etc.  Non-urgent messages can be sent to your provider as well.   To learn more about what you can do with MyChart, go to [email or web address].   Other Instructions None

## 2024-03-25 NOTE — Progress Notes (Signed)
 "  Cardiology Consultation:    Date:  03/25/2024   ID:  Ana Ruiz, DOB 1966/01/05, MRN [id number]  PCP:  Aletha Bene, MD  Cardiologist:  Alean SAUNDERS Saiya Crist, MD   Referring MD: Aletha Bene, MD   No chief complaint on file.    ASSESSMENT AND PLAN:   [name] 59 year old woman history of diabetes mellitus, hypertension, hyperlipidemia, elevated heart rates at baseline, C-spine surgery in 2013 and hysterectomy in May 2025 in the setting of postmenopausal bleeding.  GERD.  OSA-pending CPAP. Did not tolerate pravastatin  and was switched to rosuvastatin  in July 2025. Echocardiogram June 2025 with normal biventricular function without significant valve abnormalities LVEF 60 to 65% and normal diastolic function.   Here for a routine follow-up visit.  Problem List Items Addressed This Visit       Cardiovascular and Mediastinum   Benign essential hypertension   Suboptimal. Target below 130/80 mmHg. Has an upper arm cuff to monitor at home and readings have been systolic 130s to 859d recently.  Titrate up amlodipine  to 7.5 mg once a day. Continue carvedilol  12.5 mg once daily. Advised to keep a log of blood pressures at home and notify us  if the blood pressure still remains suboptimal.      Relevant Medications   amLODipine  (NORVASC ) 5 MG tablet     Other   Hyperlipidemia - Primary   Lipid panel from 01/09/2024 total cholesterol 199, HDL 96, triglycerides 57 and LDL 89.  Improved levels. Continue rosuvastatin  10 mg once daily. Tolerating the medication well. Continue with dietary modifications.       Relevant Medications   amLODipine  (NORVASC ) 5 MG tablet   Return to clinic tentatively in 1 year.  Earlier follow-up on an as-needed basis.   History of Present Illness:    Ana Ruiz is a 59 y.o. female who is being seen today for follow-up visit. PCP is Aletha Bene, MD. Last visit with me in the office was 09/22/2023.  Here for visit by herself.   She works in engineering geologist.  Has history of diabetes mellitus, hypertension, hyperlipidemia, elevated heart rates at baseline, C-spine surgery in 2013 and hysterectomy in May 2025 in the setting of postmenopausal bleeding.  GERD.  OSA-pending CPAP. Did not tolerate pravastatin  and was switched to rosuvastatin  in July 2025. Echocardiogram June 2025 with normal biventricular function without significant valve abnormalities LVEF 60 to 65% and normal diastolic function.  Lipid panel from 01/09/2024 total cholesterol 199, HDL 96, triglycerides 57 and LDL 89. BUN 8, creatinine 0.73, eGFR 95 Sodium 141 and potassium 3.5. Normal transaminases and alkaline phosphatase Hemoglobin A1c 5.7, good improvement in comparison to 6.8 level 9 months ago.  Has lost 15 pounds since her last visit with us  in July.  20 pounds decrease since her initial visit with me in May 2025. Mentions that she has been able to manage this with dietary modifications and walking.  Walks up to a mile and a half 3 times a week.  Overall symptomatically she feels much better with weight loss and regular exercise. Denies any symptoms of chest pain, shortness of breath, orthopnea paroxysmal nocturnal dyspnea. No palpitations, lightheadedness, dizziness or syncopal episodes.  Blood pressures at home typically around 130s over 80s.  Mentions over the past week they were slightly elevated with systolic in 140s. Has mild headache when blood pressures are elevated.  Reports good compliance with her medications.  She has been prescribed CPAP, currently awaiting delivery.  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     Hyperlipidemia 08/09/2014   Insomnia 02/07/2017   Intramural and submucous leiomyoma of uterus 04/17/2023   Neck pain, bilateral 12/26/2015   Need for shingles vaccine 06/05/2023   Obesity (BMI 30-39.9) 05/16/2014   Obesity due to excess calories with serious comorbidity 05/16/2014   Other constipation 05/11/2015   Persistent headaches 12/21/2013   Last Assessment & Plan:   Start Elavil  25mg  PO QHS. RTC for CPE in the next few weeks.     Plantar fasciitis, bilateral 02/06/2015   Postmenopausal bleeding 04/17/2023   Preventative health care 01/09/2016   S/P cervical spinal fusion 04/15/2023   S/P foot surgery, right 05/09/2021   Sinus tachycardia 07/22/2023   Strain of thoracic spine 12/26/2015   Type 2 diabetes mellitus with hyperglycemia, without long-term current use of insulin (HCC) 07/27/2019    Past Surgical History:  Procedure Laterality Date   BACK SURGERY     ANTERIOR CERVICAL DISCECTOMY W/ FUSION   FOOT SURGERY Right 05/04/2021   Dr Joaquim- CHEILECTOMY   HYSTEROSCOPY WITH D & C  08/13/2022   Dr Kathy   TUBAL LIGATION      Current Medications: Active Medications[1]   Allergies:   Meloxicam and Naproxen   Social History   Socioeconomic History   Marital status: Married    Spouse name: Not on file   Number of children: Not on file   Years of education: Not on file   Highest education level: Associate degree: occupational, scientist, product/process development, or vocational program  Occupational History   Not on file  Tobacco Use   Smoking status: Never   Smokeless tobacco: Never  Vaping Use   Vaping status: Never Used  Substance and Sexual Activity   Alcohol use: Never   Drug use: Never   Sexual activity: Not on file  Other Topics Concern   Not on  file  Social History Narrative   Not on file   Social Drivers of Health   Tobacco Use: Low Risk (03/25/2024)   Patient History    Smoking Tobacco Use: Never    Smokeless Tobacco Use: Never    Passive Exposure: Not on file  Financial Resource Strain: Low Risk (01/02/2024)   Overall Financial Resource Strain (CARDIA)    Difficulty of Paying Living Expenses: Not very hard  Food Insecurity: No Food Insecurity (01/02/2024)   Epic    Worried About Programme Researcher, Broadcasting/film/video in the Last Year: Never true    Ran Out of Food in the Last Year: Never true  Transportation Needs: No Transportation Needs (01/02/2024)   Epic    Lack of Transportation (Medical): No    Lack of Transportation (Non-Medical): No  Physical Activity: Insufficiently Active (01/02/2024)   Exercise Vital Sign    Days of Exercise per Week: 3 days    Minutes of Exercise per Session: 30 min  Stress: Stress Concern Present (01/02/2024)   [employer] of Occupational Health - Occupational Stress  Questionnaire    Feeling of Stress: Very much  Social Connections: Socially Integrated (01/02/2024)   Social Connection and Isolation Panel    Frequency of Communication with Friends and Family: More than three times a week    Frequency of Social Gatherings with Friends and Family: Three times a week    Attends Religious Services: More than 4 times per year    Active Member of Clubs or Organizations: Yes    Attends Banker Meetings: More than 4 times per year    Marital Status: Married  Depression (PHQ2-9): Medium Risk (09/08/2023)   Depression (PHQ2-9)    PHQ-2 Score: 6  Alcohol Screen: Low Risk (01/02/2024)   Alcohol Screen    Last Alcohol Screening Score (AUDIT): 2  Housing: Low Risk (01/02/2024)   Epic    Unable to Pay for Housing in the Last Year: No    Number of Times Moved in the Last Year: 0    Homeless in the Last Year: No  Utilities: Low Risk (05/12/2022)   Received from [facility]   Utilities    In  the past 12 months has the electric, gas, oil, or water company threatened to shut off services in your home? : No  Health Literacy: Not on file     Family History: The patient's family history includes Colon cancer in her mother; Diabetes Mellitus II in her maternal aunt; Heart attack in her maternal uncle, maternal uncle, and maternal uncle; Heart disease in her maternal uncle and mother; Hypertension in her maternal uncle and mother; Leukemia in her cousin; Lymphoma in her cousin; Ovarian cancer in her mother; Pulmonary embolism in her mother. ROS:   Please see the history of present illness.    All 14 point review of systems negative except as described per history of present illness.  EKGs/Labs/Other Studies Reviewed:    The following studies were reviewed today:   EKG:       Recent Labs: 06/05/2023: TSH 1.63 01/09/2024: ALT 15; BUN 8; Creat 0.73; Hemoglobin 12.6; Platelets 226; Potassium 3.5; Sodium 141  Recent Lipid Panel    Component Value Date/Time   CHOL 199 01/09/2024 1209   TRIG 57 01/09/2024 1209   HDL 96 01/09/2024 1209   CHOLHDL 2.1 01/09/2024 1209   LDLCALC 89 01/09/2024 1209    Physical Exam:    VS:  BP 138/88   Pulse 100   Ht 5' 5 (1.651 m)   Wt 193 lb 12.8 oz (87.9 kg)   LMP 11/12/2010   SpO2 97%   BMI 32.25 kg/m     Wt Readings from Last 3 Encounters:  03/25/24 193 lb 12.8 oz (87.9 kg)  03/23/24 196 lb 8 oz (89.1 kg)  02/02/24 202 lb 9.6 oz (91.9 kg)     GENERAL:  Well nourished, well developed in no acute distress NECK: No JVD CARDIAC: RRR, S1 and S2 present, no murmurs, no rubs, no gallops Extremities: No pitting pedal edema. Pulses bilaterally symmetric with radial 2+ and dorsalis pedis 2+ NEUROLOGIC:  Alert and oriented x 3  Medication Adjustments/Labs and Tests Ordered: Current medicines are reviewed at length with the patient today.  Concerns regarding medicines are outlined above.  No orders of the defined types were placed in this  encounter.  Meds ordered this encounter  Medications   amLODipine  (NORVASC ) 5 MG tablet    Sig: Take 1.5 tablets (7.5 mg total) by mouth daily.    Dispense:  180 tablet    Refill:  3    Signed, Johannah Rozas reddy Sia Gabrielsen, MD, MPH, [facility]. 03/25/2024 4:34 PM    [facility]    [1]  Current Meds  Medication Sig   amitriptyline  (ELAVIL ) 50 MG tablet Take 50 mg by mouth at bedtime.   amLODipine  (NORVASC ) 5 MG tablet Take 1.5 tablets (7.5 mg total) by mouth daily.   Blood Glucose Monitoring Suppl (GLUCOCOM BLOOD GLUCOSE MONITOR) DEVI 1 each by Other route See admin instructions.   carvedilol  (COREG ) 12.5 MG tablet Take 1 tablet (12.5 mg total) by mouth 2 (two) times daily.   doxepin  (SINEQUAN ) 10 MG capsule Take 1 capsule (10 mg total) by mouth at bedtime as needed.   glucose blood (PRECISION QID TEST) test strip 1 each by Other route as directed.   linaclotide  (LINZESS ) 290 MCG CAPS capsule Take 1 capsule (290 mcg total) by mouth daily before breakfast.   methocarbamol  (ROBAXIN ) 500 MG tablet Take 1 tablet (500 mg total) by mouth every 8 (eight) hours as needed for muscle spasms.   MOUNJARO  5 MG/0.5ML Pen ADMINISTER 5 MG UNDER THE SKIN 1 TIME A WEEK   rosuvastatin  (CRESTOR ) 10 MG tablet Take 1 tablet (10 mg total) by mouth daily.   tiZANidine  (ZANAFLEX ) 4 MG tablet Take 1 tablet (4 mg total) by mouth at bedtime.   traZODone  (DESYREL ) 50 MG tablet Take 1 tablet (50 mg total) by mouth at bedtime.   venlafaxine  XR (EFFEXOR  XR) 75 MG 24 hr capsule Take 1 capsule (75 mg total) by mouth daily with breakfast.   [DISCONTINUED] amLODipine  (NORVASC ) 5 MG tablet Take 1 tablet (5 mg total) by mouth daily.   "

## 2024-04-01 ENCOUNTER — Encounter: Payer: Self-pay | Admitting: Pulmonary Disease

## 2024-04-05 NOTE — Telephone Encounter (Signed)
 Pt wishes to use someone else other than [facility]. Can you help with this?

## 2024-04-06 NOTE — Telephone Encounter (Signed)
 Patient has [employer] and I have now sent the Cpap order to [facility]

## 2024-04-12 ENCOUNTER — Ambulatory Visit: Admitting: Family Medicine

## 2024-05-18 ENCOUNTER — Ambulatory Visit: Admitting: Primary Care
